# Patient Record
Sex: Male | Born: 1945
Health system: Southern US, Community
[De-identification: ages and names within clinical notes are randomized; demographics above are authoritative.]

## PROBLEM LIST (undated history)

## (undated) DIAGNOSIS — R32 Unspecified urinary incontinence: Secondary | ICD-10-CM

## (undated) DIAGNOSIS — N400 Enlarged prostate without lower urinary tract symptoms: Secondary | ICD-10-CM

## (undated) DIAGNOSIS — I639 Cerebral infarction, unspecified: Secondary | ICD-10-CM

## (undated) DIAGNOSIS — R361 Hematospermia: Secondary | ICD-10-CM

## (undated) DIAGNOSIS — M5136 Other intervertebral disc degeneration, lumbar region: Secondary | ICD-10-CM

## (undated) DIAGNOSIS — R21 Rash and other nonspecific skin eruption: Secondary | ICD-10-CM

## (undated) DIAGNOSIS — N481 Balanitis: Secondary | ICD-10-CM

## (undated) DIAGNOSIS — E7800 Pure hypercholesterolemia, unspecified: Secondary | ICD-10-CM

## (undated) DIAGNOSIS — R3911 Hesitancy of micturition: Secondary | ICD-10-CM

## (undated) DIAGNOSIS — I509 Heart failure, unspecified: Secondary | ICD-10-CM

## (undated) DIAGNOSIS — I2510 Atherosclerotic heart disease of native coronary artery without angina pectoris: Secondary | ICD-10-CM

## (undated) DIAGNOSIS — M79659 Pain in unspecified thigh: Secondary | ICD-10-CM

## (undated) DIAGNOSIS — K219 Gastro-esophageal reflux disease without esophagitis: Secondary | ICD-10-CM

## (undated) DIAGNOSIS — E119 Type 2 diabetes mellitus without complications: Secondary | ICD-10-CM

## (undated) DIAGNOSIS — R339 Retention of urine, unspecified: Secondary | ICD-10-CM

## (undated) DIAGNOSIS — N390 Urinary tract infection, site not specified: Secondary | ICD-10-CM

## (undated) DIAGNOSIS — R55 Syncope and collapse: Secondary | ICD-10-CM

## (undated) DIAGNOSIS — M51369 Other intervertebral disc degeneration, lumbar region without mention of lumbar back pain or lower extremity pain: Secondary | ICD-10-CM

## (undated) DIAGNOSIS — M5416 Radiculopathy, lumbar region: Secondary | ICD-10-CM

## (undated) DIAGNOSIS — I10 Essential (primary) hypertension: Secondary | ICD-10-CM

## (undated) HISTORY — DX: Hematospermia: R36.1

## (undated) HISTORY — DX: Cerebral infarction, unspecified: I63.9

## (undated) HISTORY — DX: Benign prostatic hyperplasia without lower urinary tract symptoms: N40.0

## (undated) HISTORY — DX: Retention of urine, unspecified: R33.9

## (undated) HISTORY — PX: APPENDECTOMY: SHX54

## (undated) HISTORY — DX: Syncope and collapse: R55

## (undated) HISTORY — DX: Rash and other nonspecific skin eruption: R21

## (undated) HISTORY — DX: Pure hypercholesterolemia, unspecified: E78.00

## (undated) HISTORY — DX: Other intervertebral disc degeneration, lumbar region without mention of lumbar back pain or lower extremity pain: M51.369

## (undated) HISTORY — DX: Heart failure, unspecified: I50.9

## (undated) HISTORY — DX: Balanitis: N48.1

## (undated) HISTORY — DX: Essential (primary) hypertension: I10

## (undated) HISTORY — DX: Unspecified urinary incontinence: R32

## (undated) HISTORY — PX: GALLBLADDER SURGERY: SHX652

## (undated) HISTORY — DX: Pain in unspecified thigh: M79.659

## (undated) HISTORY — DX: Gastro-esophageal reflux disease without esophagitis: K21.9

## (undated) HISTORY — PX: SHOULDER SURGERY: SHX246

## (undated) HISTORY — PX: CORONARY ANGIOPLASTY: SHX604

## (undated) HISTORY — PX: CARDIAC SURGERY: SHX584

## (undated) HISTORY — PX: CARDIAC CATHETERIZATION: SHX172

## (undated) HISTORY — PX: BACK SURGERY: SHX140

## (undated) HISTORY — DX: Urinary tract infection, site not specified: N39.0

## (undated) HISTORY — PX: REPLACEMENT TOTAL KNEE: SUR1224

## (undated) HISTORY — DX: Hesitancy of micturition: R39.11

## (undated) HISTORY — DX: Atherosclerotic heart disease of native coronary artery without angina pectoris: I25.10

## (undated) HISTORY — PX: CHOLECYSTECTOMY: SHX55

## (undated) HISTORY — DX: Other intervertebral disc degeneration, lumbar region: M51.36

## (undated) HISTORY — DX: Radiculopathy, lumbar region: M54.16

## (undated) HISTORY — PX: LAMINECTOMY: SHX219

## (undated) HISTORY — DX: Type 2 diabetes mellitus without complications: E11.9

---

## 1999-10-17 ENCOUNTER — Ambulatory Visit (HOSPITAL_BASED_OUTPATIENT_CLINIC_OR_DEPARTMENT_OTHER): Admission: RE | Admit: 1999-10-17 | Discharge: 1999-10-17 | Payer: Self-pay | Admitting: Orthopedic Surgery

## 2000-04-25 ENCOUNTER — Ambulatory Visit (HOSPITAL_BASED_OUTPATIENT_CLINIC_OR_DEPARTMENT_OTHER): Admission: RE | Admit: 2000-04-25 | Discharge: 2000-04-25 | Payer: Self-pay | Admitting: Orthopedic Surgery

## 2000-05-17 ENCOUNTER — Emergency Department (HOSPITAL_COMMUNITY): Admission: EM | Admit: 2000-05-17 | Discharge: 2000-05-18 | Payer: Self-pay | Admitting: Emergency Medicine

## 2000-07-24 ENCOUNTER — Encounter: Payer: Self-pay | Admitting: Orthopedic Surgery

## 2000-07-28 ENCOUNTER — Inpatient Hospital Stay (HOSPITAL_COMMUNITY): Admission: RE | Admit: 2000-07-28 | Discharge: 2000-08-01 | Payer: Self-pay | Admitting: Orthopedic Surgery

## 2001-09-23 ENCOUNTER — Ambulatory Visit (HOSPITAL_BASED_OUTPATIENT_CLINIC_OR_DEPARTMENT_OTHER): Admission: RE | Admit: 2001-09-23 | Discharge: 2001-09-23 | Payer: Self-pay | Admitting: Orthopedic Surgery

## 2002-05-07 ENCOUNTER — Inpatient Hospital Stay (HOSPITAL_COMMUNITY): Admission: EM | Admit: 2002-05-07 | Discharge: 2002-05-09 | Payer: Self-pay | Admitting: Cardiology

## 2002-05-28 ENCOUNTER — Inpatient Hospital Stay (HOSPITAL_COMMUNITY): Admission: EM | Admit: 2002-05-28 | Discharge: 2002-05-31 | Payer: Self-pay | Admitting: Cardiology

## 2002-05-31 ENCOUNTER — Encounter: Payer: Self-pay | Admitting: Cardiology

## 2002-06-10 ENCOUNTER — Encounter: Payer: Self-pay | Admitting: Cardiology

## 2002-06-10 ENCOUNTER — Inpatient Hospital Stay (HOSPITAL_COMMUNITY): Admission: EM | Admit: 2002-06-10 | Discharge: 2002-06-12 | Payer: Self-pay | Admitting: Cardiology

## 2003-11-07 ENCOUNTER — Ambulatory Visit (HOSPITAL_COMMUNITY): Admission: RE | Admit: 2003-11-07 | Discharge: 2003-11-07 | Payer: Self-pay | Admitting: Neurosurgery

## 2007-11-06 ENCOUNTER — Encounter: Admission: RE | Admit: 2007-11-06 | Discharge: 2007-11-06 | Payer: Self-pay | Admitting: Neurosurgery

## 2008-02-09 ENCOUNTER — Inpatient Hospital Stay (HOSPITAL_COMMUNITY): Admission: RE | Admit: 2008-02-09 | Discharge: 2008-02-15 | Payer: Self-pay | Admitting: Neurosurgery

## 2008-03-17 ENCOUNTER — Encounter: Admission: RE | Admit: 2008-03-17 | Discharge: 2008-03-17 | Payer: Self-pay | Admitting: Neurosurgery

## 2008-05-31 ENCOUNTER — Encounter: Admission: RE | Admit: 2008-05-31 | Discharge: 2008-05-31 | Payer: Self-pay | Admitting: Neurosurgery

## 2008-06-05 ENCOUNTER — Encounter: Admission: RE | Admit: 2008-06-05 | Discharge: 2008-06-05 | Payer: Self-pay | Admitting: Neurosurgery

## 2008-07-18 ENCOUNTER — Inpatient Hospital Stay (HOSPITAL_COMMUNITY): Admission: RE | Admit: 2008-07-18 | Discharge: 2008-07-24 | Payer: Self-pay | Admitting: Neurosurgery

## 2009-03-19 IMAGING — CR DG LUMBAR SPINE 2-3V
3 series · 3 of 3 positions shown · non-contrast
Comparison: Intraoperative imaging on 02/09/2008

CLINICAL DATA: Status post posterior lumbar fusion at L4-5 on
02/09/2008.  The patient has complaint of weakness.

LUMBAR SPINE - 2-3 VIEW

[view not recorded (1 of 3)]
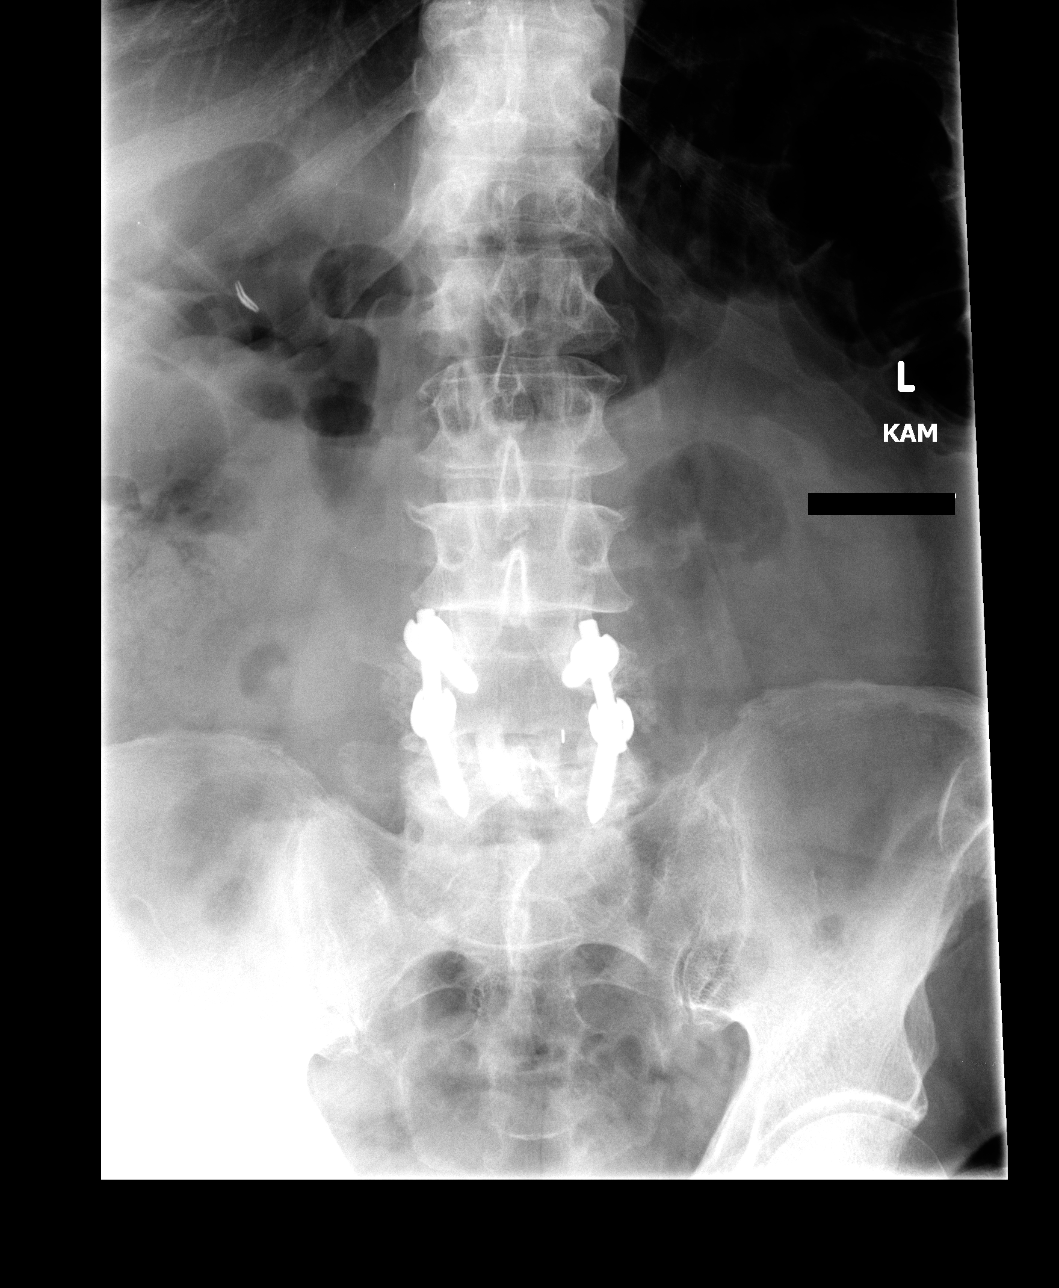

[view not recorded (2 of 3)]
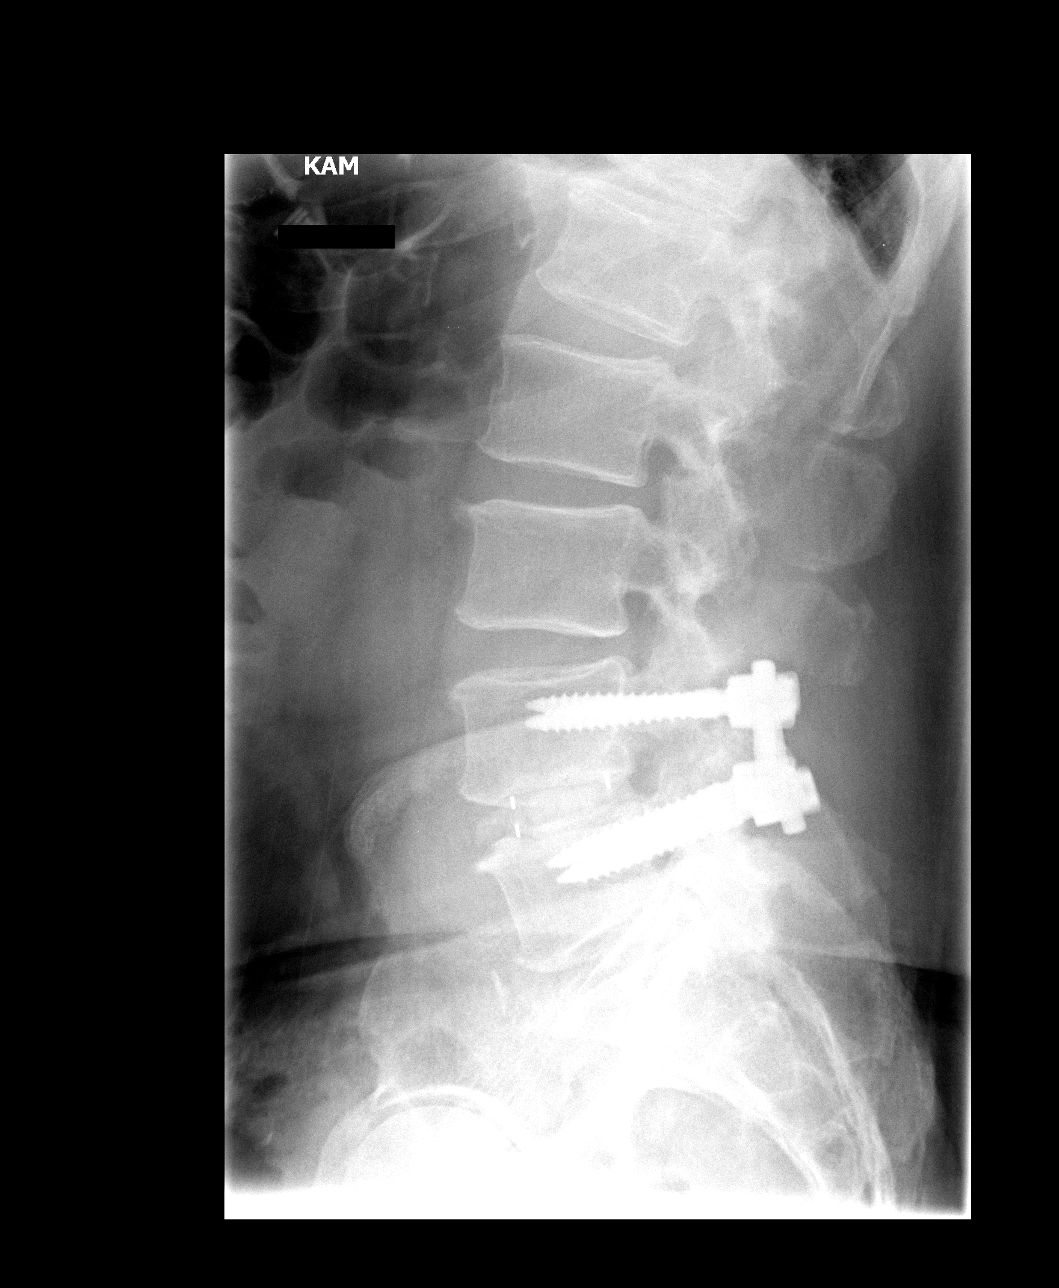

[view not recorded (3 of 3)]
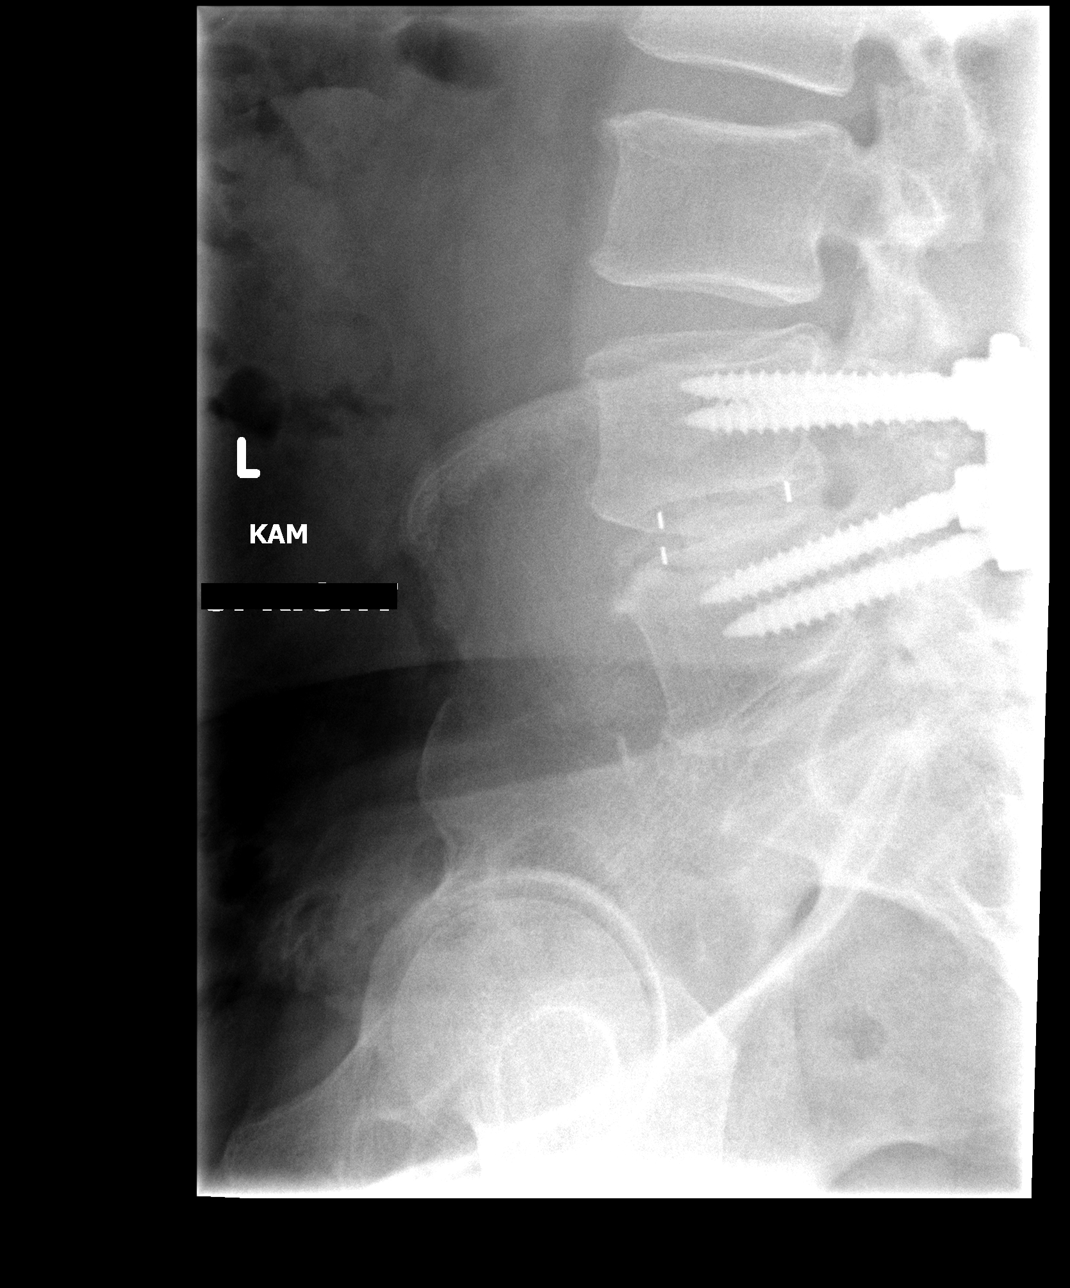

[3 of 3 positions shown; findings below may reference images not displayed]

FINDINGS: Frontal and lateral projections demonstrate stable and
anatomic alignment at the L4-5 level following posterior lumbar
interbody fusion with bilateral L4 laminectomy.  Hardware and
allograft material show normal alignment.  No evidence of fracture.
No abnormal lucency identified.  Other levels of the lumbar spine
demonstrate mild spondylosis and normal alignment.
IMPRESSION: Normal alignment following recent posterior lumbar fusion at L4-5.
No complicating features are seen by standard radiographs.

## 2010-04-15 LAB — GLUCOSE, CAPILLARY
Glucose-Capillary: 135 mg/dL — ABNORMAL HIGH (ref 70–99)
Glucose-Capillary: 140 mg/dL — ABNORMAL HIGH (ref 70–99)
Glucose-Capillary: 53 mg/dL — ABNORMAL LOW (ref 70–99)
Glucose-Capillary: 77 mg/dL (ref 70–99)
Glucose-Capillary: 108 mg/dL — ABNORMAL HIGH (ref 70–99)
Glucose-Capillary: 110 mg/dL — ABNORMAL HIGH (ref 70–99)
Glucose-Capillary: 131 mg/dL — ABNORMAL HIGH (ref 70–99)
Glucose-Capillary: 151 mg/dL — ABNORMAL HIGH (ref 70–99)
Glucose-Capillary: 63 mg/dL — ABNORMAL LOW (ref 70–99)
Glucose-Capillary: 65 mg/dL — ABNORMAL LOW (ref 70–99)

## 2010-04-15 LAB — TYPE AND SCREEN

## 2010-04-15 LAB — BASIC METABOLIC PANEL
BUN: 10 mg/dL (ref 6–23)
BUN: 14 mg/dL (ref 6–23)
CO2: 31 mEq/L (ref 19–32)
Chloride: 102 mEq/L (ref 96–112)
Chloride: 99 mEq/L (ref 96–112)
GFR calc Af Amer: >60 mL/min (ref >60)
Potassium: 3 mEq/L — ABNORMAL LOW (ref 3.5–5.1)

## 2010-04-15 LAB — CBC
HCT: 41.9 % (ref 39.0–52.0)
Hemoglobin: 14.2 g/dL (ref 13.0–17.0)
RBC: 4.65 MIL/uL (ref 4.22–5.81)
RDW: 14.7 % (ref 11.5–15.5)
WBC: 8.3 10*3/uL (ref 4.0–10.5)

## 2010-04-23 LAB — DIFFERENTIAL
Basophils Absolute: 0.1 10*3/uL (ref 0.0–0.1)
Basophils Relative: 1 % (ref 0–1)
Eosinophils Absolute: 0.2 10*3/uL (ref 0.0–0.7)
Eosinophils Relative: 2 % (ref 0–5)
Monocytes Absolute: 0.9 10*3/uL (ref 0.1–1.0)

## 2010-04-23 LAB — CBC
HCT: 45.8 % (ref 39.0–52.0)
Hemoglobin: 15.4 g/dL (ref 13.0–17.0)
MCHC: 33.7 g/dL (ref 30.0–36.0)
MCV: 93.1 fL (ref 78.0–100.0)
RDW: 14.2 % (ref 11.5–15.5)

## 2010-04-24 LAB — GLUCOSE, CAPILLARY
Glucose-Capillary: 136 mg/dL — ABNORMAL HIGH (ref 70–99)
Glucose-Capillary: 139 mg/dL — ABNORMAL HIGH (ref 70–99)
Glucose-Capillary: 145 mg/dL — ABNORMAL HIGH (ref 70–99)
Glucose-Capillary: 148 mg/dL — ABNORMAL HIGH (ref 70–99)
Glucose-Capillary: 148 mg/dL — ABNORMAL HIGH (ref 70–99)
Glucose-Capillary: 150 mg/dL — ABNORMAL HIGH (ref 70–99)
Glucose-Capillary: 153 mg/dL — ABNORMAL HIGH (ref 70–99)
Glucose-Capillary: 153 mg/dL — ABNORMAL HIGH (ref 70–99)
Glucose-Capillary: 153 mg/dL — ABNORMAL HIGH (ref 70–99)
Glucose-Capillary: 182 mg/dL — ABNORMAL HIGH (ref 70–99)
Glucose-Capillary: 195 mg/dL — ABNORMAL HIGH (ref 70–99)

## 2010-04-24 LAB — PROTIME-INR
INR: 1 (ref 0.00–1.49)
Prothrombin Time: 13.7 seconds (ref 11.6–15.2)

## 2010-04-24 LAB — POCT I-STAT 4, (NA,K, GLUC, HGB,HCT)
Glucose, Bld: 156 mg/dL — ABNORMAL HIGH (ref 70–99)
Hemoglobin: 15 g/dL (ref 13.0–17.0)
Potassium: 3.5 mEq/L (ref 3.5–5.1)

## 2010-05-22 NOTE — Op Note (Signed)
[name], [name] NO.:  [id number]   MEDICAL RECORD NO.:  [id number]          PATIENT TYPE:  INP   LOCATION:  3034                         FACILITY:  [facility]   PHYSICIAN:  Kathaleen Maser. Pool, M.D.    DATE OF BIRTH:  1945/11/25   DATE OF PROCEDURE:  07/18/2008  DATE OF DISCHARGE:                               OPERATIVE REPORT   PREOPERATIVE DIAGNOSIS:  L3-L4 degenerative disk disease/herniated  nucleus pulposus with stenosis.  Status post L4-L5 decompression and  fusion with instrumentation.   POSTOPERATIVE DIAGNOSIS:  L3-L4 degenerative disk disease/herniated  nucleus pulposus with stenosis.  Status post L4-L5 decompression and  fusion with instrumentation.   PROCEDURE:  Re-exploration of L4-L5 fusion with removal of  instrumentation.  L3-L4 redo decompressive laminectomy with bilateral L3  and L4 decompressive foraminotomies, more than would be required for  simple interbody fusion alone.  L3-L4 posterior lumbar fusion utilizing  tangent interbody allograft wedge Telamonted by PEEK cage and local  autografting.  L3, L4, L5 posterolateral arthrodesis utilizing segmental  pedicle screw fixation and local autografting.   SURGEON:  Kathaleen Maser. Pool, MD   ASSISTANT:  Donalee Citrin, MD   ANESTHESIA:  General endotracheal.   INDICATION:  [name] is a 65 year old male who is approximately 5  months status post L4-L5 decompression and fusion with instrumentation.  The patient presents with worsening back and bilateral lower extremity  pain.  Workup demonstrates evidence of disk degeneration, disk space  collapse and disk herniation at L3-L4 which is above the level of his  fusion.  This results in moderately severe stenosis.  The patient has  failed conservative management and presents now for decompression and  fusion at the L3-L4 level.  This will require revising of the L4-L5  instrumentation.   OPERATIVE NOTE:  The patient was brought to the operating room and  placed on operating table in supine position.  After adequate level of  anesthesia was achieved, the patient was placed prone onto Wilson frame  and appropriately padded.  The patient's lumbar region was prepped and  draped sterilely.  A 10 blade was used to make a curvilinear skin  incision overlying the L3, L4 and L5 levels.  This was carried down  sharply in midline.  A subperiosteal dissection was then performed to  expose the lamina facet joints of L3, L4 as well as the previous fusion  at L4-L5.  Instrumentation at L4-L5 was exposed.  Pedicle screw  instrumentation was disassembled.  The pedicle screws themselves were  solidly in position and the fusion appeared to be maturing nicely.  Retractor was then placed over the L3-L4 level.  Previous laminectomy at  L3-L4 was dissected free.  Complete laminectomy of L3 was then performed  using Leksell rongeurs, Kerrison rongeurs, high-speed drill to remove  the entire lamina of L3.  Complete inferior facetectomies of L3 and  superior facetectomies of L4 were performed bilaterally.  All bone was  cleaned and used for later autografting.  Ligamentum flavum and epidural  scar were elevated and resected in piecemeal fashion using Kerrison  rongeurs.  Epidural venous  plexus was coagulated and cut.  Bilateral  diskectomies were then performed at L3-L4.  Disk space was then  distracted up to 10 mm through with the disk space distracted and nerve  roots protected starting first at the patient's left side.  Disk space  was then reamed and then cut with 10 mm tangent instruments.  Soft  tissues were then removed from interspace.  A 10 x 26 mm Telamon cage  was packed with morselized autograft then packed into place and recessed  approximately 2 mm from posterior cortical margin of L3.  The fracture  was removed from the patient's right side.  Thecal sac and nerve roots  were inspected on the right side.  Disk space once again reamed and cut  with a  10 mm tangent instrument.  Soft tissues were removed from the  interspace.  Disk space was further curettaged using Epstein curettes.  Morselized autograft was packed into interspace for later fusion.  A 10  x 26 mm tangent wedge was then packed into place and recessed  approximately 2 mm from posterior cortical margin of L3.  Pedicles were  all freed were then identified bilaterally using superficial surface  landmarks and intraoperative fluoroscopy.  Superficial bone around the  pedicle was then removed with a high-speed drill.  Each pedicle was then  probed using pedicle awl.  The pedicle awl track was then tapped with a  5.25 mm screw taper.  Each screw tap hole was then probed and found to  be solidly within bone.  A 6.75 x 50 mm radius screws were placed  bilaterally at L3.  Transverse processes of L3, L4 and L5 were then  decorticated using high speed drill.  Morselized autograft was packed  posterolaterally for later fusion.  A short segment of titanium rods  were then placed over the screw heads at L3, L4 and L5.  Locking caps  were placed over screw heads and locking caps were then engaged with  construct under compression.  Final images revealed good position of  bone graft and hardware at proper operative level with normal alignment  of spine.  The wound was then irrigated with antibiotic solution.  Gelfoam was placed topically for hemostasis and found to be good.  A  medium Hemovac drain was left in epidural space.  A transverse connector  had been placed earlier.  The wound was then closed in layers with  Vicryl suture.  Steri-Strips and sterile dressing were applied.  There  were no complications.  The patient tolerated the procedure well and he  returned to recovery room postoperatively.           ______________________________  Kathaleen Maser Pool, M.D.     HAP/MEDQ  D:  07/18/2008  T:  07/19/2008  Job:  [id number]

## 2010-05-22 NOTE — Op Note (Signed)
[name], [name] NO.:  [id number]   MEDICAL RECORD NO.:  [id number]          PATIENT TYPE:  INP   LOCATION:  3034                         FACILITY:  [facility]   PHYSICIAN:  Kathaleen Maser. Pool, M.D.    DATE OF BIRTH:  August 23, 1945   DATE OF PROCEDURE:  02/09/2008  DATE OF DISCHARGE:                               OPERATIVE REPORT   PREOPERATIVE DIAGNOSIS:  L4-5 post laminectomy spondylolisthesis with  instability and stenosis.   POSTOPERATIVE DIAGNOSIS:  L4-5 post laminectomy spondylolisthesis with  instability and stenosis.   PROCEDURES:  Re-exploration of bilateral L4-5, decompressive  laminotomies with bilateral L4-5 redo-laminectomy and diskectomies, more  than  would be required for simple interbody fusion alone.  Bilateral L4  and L5 decompressive foraminotomies.  L4-5 posterior lumbar fusion  utilizing tangent interbody allograft wedge, Telamon interbody PEEK  cage, and local autografting.  L4-5 posterolateral arthrodesis utilizing  nonsegmental pedicle screw fixation and local autografting.   SURGEON:  Kathaleen Maser. Pool, MD   ASSISTANT:  Reinaldo Meeker, MD   ANESTHESIA:  General endotracheal.   INDICATIONS:  [name] [name] is a 65 year old male status post previous  bilateral L4-5 decompressive laminotomies for stenosis.  The patient  presents now with worsening back and bilateral lower extremity pain.  Workup demonstrates evidence of an unstable spondylolisthesis at L4-5  with marked stenosis.  The patient presents now for decompression and  fusion in hopes of improving his symptoms.   OPERATIVE NOTE:  The patient was brought to the operating room and  placed on table in supine position.  After adequate level of anesthesia  was achieved, the patient was placed prone onto Wilson frame,  appropriately padded.  The patient's lumbar region was prepped and  draped in sterilely.  A 10 blade was used to make a curvilinear skin  incision overlying the L4-5  interspace.  This was carried down sharply  in the midline.  Subperiosteal dissection was then performed exposing  the lamina and facet joints of L3-4 and L5.  Deep self-retaining  retractor was placed.  Intraoperative fluoroscopy was used and levels  were confirmed.  Transverse process of L4 and L5 were also exposed.  Previous laminotomies at L4-5 were then dissected free bilaterally.  Epidural scar was removed.  Complete laminectomies of L4 was performed  bilaterally as well as inferior facetectomies at L4 and superior  facetectomies at L5.  Partial superior laminectomy at L5 was also  performed.  All bone was cleaned and used in later autografting.  Ligament flavum and epidural scar was elevated and resected.  The  underlying thecal sac and exiting L4 and L5 nerve roots were identified.  Wide decompressive foraminotomies were then performed along the course  of exiting L4 and L5 nerve roots bilaterally.  Epidural venous plexus  was coagulated and cut.  Bilateral diskectomies were then performed.  Disk space was then prepared for interbody fusion.  First, the disk  space was distracted up to 12 mm and a 12-mm distractor was left in  place in the patient's left side.  Thecal sac nerve roots were checked  on  the right side.  Disk space was then reamed and cut with 12-mm  tangent instrument.  Soft tissues were removed from the interspace.  A  12 x 26 mm Telamon cage packed with morselized autograft and then packed  into place and recessed approximately 3 mm from the posterior cortical  margin at L4.  Distractor was removed from the patient's left side.  Thecal sac and nerve spared on the left side.  Disk space was then  reamed and then cut with a 12-mm tangent instrument.  Soft tissue was  then removed from the interspace.  Disk space was further curettaged.  Morselized autograft was packed in the interspace for later fusion.  A  12 x 26 mm tangent wedge was then packed into place and  recessed  approximately 3 mm from the posterior cortical margin of L4.  Pedicles  of L4 and L5 were then identified using surface landmarks and  intraoperative fluoroscopy.  Superficial bone around the pedicle was  then removed using high-speed drill.  The pedicle hole was then probed  using pedicle awl.  Pedicle awl track was then tapped with 5.25 screw  and screwed down hole was probed and found to be solid with the bone.  A  6.75 x 45 mm radius screw was placed bilaterally at L4 and L5.  Transverse processes of L4 and L5 were then decorticated using high-  speed drill.  Morselized autograft was packed posterolaterally for later  fusion.  Short segment titanium rod was then placed over screw heads at  L4 and L5.  Locking caps were then placed in the screw.  Locking caps  were then engaged with construct under compression.  Final images  revealed good position of bone grafts and proper operative level with  normal alignment of spine.  Wound was then irrigated with antibiotic  solution.  Gelfoam was placed topically.  Hemostasis was found to be  good.  A medium Hemovac drain was left in the interspace.  Wounds were  then closed in layers with Vicryl suture.  Steri-Strips and sterile  dressing were applied.  No complications.  The patient was well and he  returns to recovery room postoperatively.           ______________________________  Kathaleen Maser Pool, M.D.     HAP/MEDQ  D:  02/09/2008  T:  02/10/2008  Job:  [id number]

## 2010-05-25 NOTE — Discharge Summary (Signed)
NAMECASPAR, FAVILA NO.:  [id number]   MEDICAL RECORD NO.:  [id number]          PATIENT TYPE:  INP   LOCATION:  3034                         FACILITY:  [facility]   PHYSICIAN:  Kathaleen Maser. Pool, M.D.    DATE OF BIRTH:  Jan 16, 1945   DATE OF ADMISSION:  02/09/2008  DATE OF DISCHARGE:  02/15/2008                               DISCHARGE SUMMARY   FINAL DIAGNOSIS:  L4-5 stenosis with degenerative disk disease.   OPERATIONS AND TREATMENT:  L4-5 bilateral redo laminectomy with  posterior lumbar interbody fusion utilizing tangent interbody allograft  wedge, Telamon interbody PEEK cage, and posterolateral arthrodesis  utilizing nonsegmental pedicle screw fixation.   HISTORY OF PRESENT ILLNESS:  Mr. Ramirez is a 65 year old male with  history of symptomatic lumbar stenosis.  He presents now for  decompression and fusion.   HOSPITAL COURSE:  The patient was taken to the operating room where an  uncomplicated lumbar decompression and fusion was performed.  Postoperatively, the patient did well.  He was somewhat slow to mobilize  secondary to pain control issues and social situation.  This gradually  improved his mobility, he got better.  He was able to be discharged  home.  At the time of discharge, his pain was very well controlled on  oral medications.  He had no lower extremity pain.  His strength and  sensation were intact.  His wound was healing well.  He was tolerating a  regular diet.   CONDITION AT DISCHARGE:  Improved.   DISCHARGE DISPOSITION:  The patient to follow up in my office in 1 week.            ______________________________  Kathaleen Maser. Pool, M.D.     HAP/MEDQ  D:  03/22/2008  T:  03/23/2008  Job:  [id number]

## 2010-05-25 NOTE — Op Note (Signed)
NAMECORDALE, MANERA NO.:  [id number]   MEDICAL RECORD NO.:  [id number]          PATIENT TYPE:  OIB   LOCATION:  2899                         FACILITY:  [facility]   PHYSICIAN:  Kathaleen Maser. Pool, M.D.    DATE OF BIRTH:  1945/02/12   DATE OF PROCEDURE:  11/07/2003  DATE OF DISCHARGE:                                 OPERATIVE REPORT   PREOPERATIVE DIAGNOSIS:  L4-5 stenosis.   POSTOPERATIVE DIAGNOSIS:  L4-5 stenosis.   PROCEDURE:  L4-5 decompressive lumbar laminectomy with foraminotomies.   SURGEON:  Kathaleen Maser. Pool, M.D.   ASSISTANT:  Reinaldo Meeker, M.D.   ANESTHESIA:  General endotracheal.   INDICATIONS FOR PROCEDURE:  The patient is a 58-yer-old male with a history  of back and bilateral lower extremity pain failing conservative management.  A workup demonstrates evidence of severe stenosis at the L4-5 level.  The  patient presents now for an L4-5 decompressive laminectomy in hopes of  improving his symptoms.   DESCRIPTION OF PROCEDURE:  The patient is placed on the operating room table  in a supine position.  After an adequate level of anesthesia had been  achieved, the patient was placed prone onto a Wilson frame and appropriately  padded.  The patient's lumbar region was prepped and draped sterilely.  A  #10 blade was used to make a linear skin incision overlying the L4-5  interspace.  This was carried down sharply in the midline.  A subperiosteal  dissection was performed to expose the lamina of the facet joints at L4 and  L5.  A deep self-retaining retractor was placed.  Intraoperative x-rays were  taken.  The level was confirmed.  A decompressive laminectomy was then  performed by using the Leksell rongeurs and Kerrison rongeurs and high-speed  drill to remove the inferior aspect of the lamina of L4, the superior aspect  of the lamina of L5, and the medial aspect of the L4 and L5 facet joints.  The ligament of flavum was then elevated and resected in a  piecemeal fashion  using Kerrison rongeurs.  The underlying thecal sac was identified.  The  lateral gutters were then undercut using Kerrison rongeurs to fully  decompress the lateral recess bilaterally.  The L4 and L5 nerve roots were  identified and found to be free of any further compression.  The wound was  then irrigated with antibiotic solution.  Gelfoam was placed topically for  hemostasis and found to be good.  The retraction system was removed.  Hemostasis in the muscles achieved with electrocautery.  The wound was then  closed in layers with Vicryl sutures.  Steri-Strips and a sterile dressing  were applied.  There were no apparent complications.  The patient returns to the recovery room postoperatively.     HAP/MEDQ  D:  11/07/2003  T:  11/07/2003  Job:  [id number]

## 2010-05-25 NOTE — H&P (Signed)
NAME:  [name] [name] NO.:  [id number]   MEDICAL RECORD NO.:  [id number]                   PATIENT TYPE:  INP   LOCATION:  2920                                 FACILITY:  [facility]   PHYSICIAN:  [place] Bing, M.D.               DATE OF BIRTH:  01/12/1945   DATE OF ADMISSION:  05/07/2002  DATE OF DISCHARGE:                                HISTORY & PHYSICAL   PRIMARY CARDIOLOGIST:  Dr. Gilford Raid Harsh.   HISTORY OF PRESENT ILLNESS:  A 65 year old gentleman transferred from  [facility] with acute myocardial infarction.  [name] [name] has not  previously been known to have coronary disease.  The night before admission,  he developed moderately-severe, sharp right upper chest pain that was  persistent and radiated through to his back.  He was diaphoretic with this  episode that lasted two to three hours.  He had subjective less intense pain  8 and 14 hours later prompting him to go to the emergency department where  no notable EKG changes were apparent.  He was admitted for observation and  serial determination of markers and EKGs.  He had no further symptoms after  initial medical treatment but was found to have markedly-elevated and mildly-  elevated CPK-MB prompting referral to [facility].   CARDIOVASCULAR RISK FACTORS:  Include diabetes and hypertension.  Both of  these problems improved substantially when the patient dieted, resulting in  a 75-pound weight loss.  He has not required medical therapy for either of  these during the past year.  There is a history of hyperlipidemia that has  been treated with low doses of simvastatin.   PAST MEDICAL HISTORY:  1. Osteoarthritis.  He has previously undergone left total knee replacement     surgery.  He has also had right shoulder surgery.  2. There is a history of depression.  3. He has had recent prostatitis and is still taking antibiotics.   CURRENT MEDICATIONS:  1. Serzone 100 mg b.i.d.  2. Simvastatin 10 mg daily.  3. Ambien 10 mg q.h.s.  4. Ciprofloxacin 500 mg b.i.d.   SOCIAL HISTORY:  Lives in [place] with wife.  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  He does not use tobacco products nor significant amounts of  alcohol.   FAMILY HISTORY:  Positive for coronary disease in his father, who suffered a  fatal myocardial infarction at age 74.   REVIEW OF SYSTEMS:  Has seasonal allergies, has occasional cough,  intermittent depression, arthralgias, GERD symptoms.  All other systems  negative.   PHYSICAL EXAMINATION:  GENERAL:  A pleasant, somewhat overweight gentleman  in no acute distress.  VITAL SIGNS:  Temperature 98, heart rate 60 and regular, respirations 20,  blood pressure 135/65, O2 saturation 99% on 2 L.  HEENT:  Anicteric sclerae.  NECK:  No jugular venous distention.  No carotid bruits.  ENDOCRINE:  No  thyromegaly.  SKIN:  No significant lesions.  HEMATOPOIETIC:  No adenopathy.  CARDIAC:  Normal first and second heart sounds.  Fourth heart sound present.  LUNGS:  Clear.  ABDOMEN:  Soft and nontender.  No organomegaly.  No bruits.  EXTREMITIES:  Decreased dorsalis pedis pulses.  Posterior tibial pulses are  bounding.  NEUROMUSCULAR:  Symmetric strength and tone.   LABORATORY STUDIES:  Chest x-ray:  NAD.  EKG reportedly shows sinus  bradycardia with first-degree AV block and no acute changes - not available  for review.  Peak CPK so far is 242 with an MB of 24; however, troponin has  increased to 41.6.  Renal function is normal.  CBC is normal.   IMPRESSION:  [name] [name] presents with somewhat atypical chest discomfort  but certainly with a non-Q myocardial infarction.  He has responded well to  initial medical therapy.  Use of beta blockers is limited by his native  sinus bradycardia.   PLAN:  He will undergo another diagnostic catheterization today with further  therapy as warranted.                                               [place] Bing,  M.D.    RR/MEDQ  D:  05/07/2002  T:  05/07/2002  Job:  [id number]

## 2010-05-25 NOTE — Op Note (Signed)
NAME:  [name] [name], [name] [name]                          ACCOUNT NO.:  [id number]   MEDICAL RECORD NO.:  [id number]                   PATIENT TYPE:  AMB   LOCATION:  DSC                                  FACILITY:  [facility]   PHYSICIAN:  Harvie Junior, M.D.                DATE OF BIRTH:  12/20/1945   DATE OF PROCEDURE:  09/23/2001  DATE OF DISCHARGE:                                 OPERATIVE REPORT   PREOPERATIVE DIAGNOSIS:  Impingement with acromioclavicular joint arthritis.   POSTOPERATIVE DIAGNOSES:  1. Impingement with acromioclavicular joint arthritis.  2. Anterior labral tear with significant fray of biceps tendon and     undersurface rotator cuff tear.   PROCEDURES:  1. Anterolateral acromioplasty.  2. Distal clavicle excision.  3. Debridement of anterior labral tear from within the glenohumeral joint as     well as debridement of insertion of biceps tendon as well as debridement     of rotator cuff undersurface.   SURGEON:  Harvie Junior, M.D.   ASSISTANT:  Currie Paris. Thedore Mins.   ANESTHESIA:  General with an interscalene block.   BRIEF HISTORY:  The patient is a 65 year old male with a long history of  significant bilateral shoulder pain.  He ultimately had been treated  conservatively with injection therapy, anti-inflammatory medication,  exercise therapy, stretch.  All of this failed, and ultimately he continued  to have pain.  He had excellent relief with his injections and we felt that  the most appropriate course of action was subacromial decompression and  distal clavicle excision.  He is brought to the operating room for this  procedure.   DESCRIPTION OF PROCEDURE:  The patient was brought to the operating room and  after adequate anesthesia was obtained with a general anesthetic, the  patient was placed supine upon the operating table and moved to the beach  chair position.  All bony prominences were well-padded.  Attention was then  turned to the right shoulder,  where after routine prepping and draping an  arthroscopic examination of the shoulder revealed that there was an obvious  anterior labral tear with catching and clinic of the joint.  There was fray  of the undersurface of the biceps tendon and fray of the insertion of the  rotator cuff.  The labral tear was debrided, the undersurface of the biceps  tendon was debrided, and the insertion of the rotator cuff was debrided.  Attention was then turned out of the glenohumeral joint.  There was no  significant arthritic change in the glenohumeral joint.  Attention was then  turned out of the glenohumeral joint into the subacromial space.  At this  point attention was turned to the acromion, where there was a significant  undersurface spur on the acromion, and this was debrided by way of an  anterolateral acromioplasty from both the lateral and posterior portal.  Cutting block technique  was used ultimately to refine the debridement of the  acromion.  Following this attention was turned to the distal clavicle, where  the distal clavicle was debrided from both the anterior and lateral  compartment.  Twenty millimeters of distal clavicle was excised, measured  with an arthroscopy probe.  At this point attention was turned back to the  rotator cuff, where the rotator cuff was evaluated.  There was no full-  thickness tearing.  There was significant bursal fray on the rotator cuff,  and this was debrided over the course of its length with a bur, in essence  performing a bursectomy as well as removing all remnants of bony fragment  from the acromioplasty.  Following this the shoulder was copiously irrigated  and suctioned dry.  The arthroscopic portals were closed with a bandage, a  sterile and compressive dressing was applied, and the patient taken to the  recovery room, where he was noted to be in stable condition.  Estimated  blood loss for the procedure was none.                                                Harvie Junior, M.D.    Ranae Plumber  D:  09/23/2001  T:  09/24/2001  Job:  [id number]

## 2010-05-25 NOTE — Discharge Summary (Signed)
NAME:  [name] [name], [name] [name]                          ACCOUNT NO.:  [id number]   MEDICAL RECORD NO.:  [id number]                   PATIENT TYPE:  INP   LOCATION:  4715                                 FACILITY:  [facility]   PHYSICIAN:  Jesse Sans. Wall, M.D.                DATE OF BIRTH:  [birth year]   DATE OF ADMISSION:  05/28/2002  DATE OF DISCHARGE:                           DISCHARGE SUMMARY - REFERRING   PROCEDURES:  1. Cardiac catheterization.  2. Coronary arteriogram.  3. Left ventriculogram.   HOSPITAL COURSE:  Mr. Fix is a 65 year old male with known coronary  artery disease.  He was admitted for an MI on 05/07/2002 and discharged on  05/09/2002.  Since discharge he had been having some right-sided sharp pains  that were described as butterflies with needles on them floating around in  my chest.  His symptoms were not associated with any exertion or meals.  He  went to [facility] and was transferred to [facility] for  further evaluation and treatment.   On 05/31/2002, a cardiac catheterization was performed, and it showed a  normal left main and an LAD with a 50% stenosis that included the first  diagonal.  The distal LAD had two 70% lesions. The circumflex was without  disease, but one of the ONs had a 70% lesion.  The RCA had a 30% proximal  stenosis and his previous CYPHER stent was widely patent.  The PLA had an  80% stenosis.  His EF was greater than 60% with no MR.   Dr. Chales Abrahams evaluated the films and felt that there was no clear culprit  lesion.  He felt that the symptoms were atypical and not necessarily  secondary to his coronary artery disease. He, therefore, felt that  outpatient evaluation with Cardiolite and follow up with Dr. Felicie Morn was  indicated.  If the Cardiolite is abnormal and in specific distribution then  possible percutaneous intervention is indicated.  Mr. Ladnier bedrest is  pending completion, but if this joint is stable with ambulation  he is  considered stable for discharge on 05/31/2002   LABORATORY VALUES:  Hemoglobin OF 14.5, hematocrit 42.8, WBC is 8.0;  platelets 203.  INR 1.1, PTT 31  Sodium 139, potassium 4.1, chloride 107,  CO2 27, BUN 14, creatinine 0.9, glucose 103.  Serial CK, MB, and troponin  are negative for MI.   Chest x-ray:  The lungs are clear.  No pneumothoraces or effusions are seen.  No acute disease.   DISCHARGE CONDITION:  Stable.   DISCHARGE DIAGNOSES:  1. Chest pain, no critical lesions by catheterization this admission.  2. Status post myocardial infarction on 05/07/2002 with CYPHER stent to the     left anterior descending.  3. Preserved left ventricular function with an ejection fraction greater     than 60% with no mitral regurgitation by catheterization this admission.  4. Hyperlipidemia.  5.  Hypertension.  6. History of prostatitis.  7. History of osteoarthritis.  8. History of depression and anxiety.  9. Adult onset diabetes mellitus.  10.      Family history of coronary artery disease.  11.      History of angioedema secondary to Accupril.  12.      Hypertension.   DISCHARGE INSTRUCTIONS:  His activity level is to include: No driving,  sexual or strenuous activity for 2 days.  He is to call the office for  problems with the cath site.  He is to stick to a low fat, diabetic diet.  He is to unassigned patient with a stress test at [facility] on  05/31 and see Dr. Phoebe Perch on 06/06.  He is to see Dr. Tomasa Blase as needed as  needed and he is to follow up at the [facility].   DISCHARGE MEDICATIONS:  1. Aspirin 325 mg daily.  2. Plavix 75 mg daily.  3. Serzone is not to be taken.  4. Pravachol 80 mg daily.  5. Toprol XL 25 mg daily.  6. Ambien and nitro as needed.  7. Flexor XR 150 mg daily prescription given.  8. Prilosec 20 mg daily.     Lavella Hammock, P.A. [facility]                  Thomas C. Wall, M.D.    RG/MEDQ  D:  05/31/2002  T:  05/31/2002  Job:  [id number]   cc:   Dr.  Fransico Him [facility]  [address]  [place]  [place] [address]  Fax: [phone]   [facility]   Veneda Melter, M.D.

## 2010-05-25 NOTE — Discharge Summary (Signed)
NAME:  REISS, MOWREY NO.:  [id number]   MEDICAL RECORD NO.:  [id number]                   PATIENT TYPE:  INP   LOCATION:  6522                                 FACILITY:  [facility]   PHYSICIAN:  Learta Codding, M.D.                 DATE OF BIRTH:  [birth year]   DATE OF ADMISSION:  06/10/2002  DATE OF DISCHARGE:  06/12/2002                           DISCHARGE SUMMARY - REFERRING   PROCEDURES:  Coronary angiogram/stent, obtuse marginal and PTCA left  anterior descending - June 4.   REASON FOR ADMISSION:  Please refer to dictated admission note.   LABORATORY DATA:  CBC normal at discharge.  Sodium 140, potassium 4.4,  glucose 123, BUN 9, creatinine 1.0 at discharge.  CPK 61/3.2 at discharge.   HOSPITAL COURSE:  Following the initial presentation to [facility]  because of recurrent chest pain and known coronary artery disease, patient  ruled out for myocardial infarction with negative serial cardiac enzymes.  However, given a recent negative adenosine Cardiolite, done just a few days  earlier, and patient's recurrent chest discomfort since a relook coronary  angiogram, recommendation was to transfer back to [facility] for  relook angiography and possible percutaneous intervention.   The following day, patient was taken back to the laboratory where he  underwent his third coronary angiogram by Dr. Chales Abrahams, in the recent past.  Old films were reviewed, and Dr. Chales Abrahams proceeded with successful stenting of  a 70% obtuse marginal lesion (TAXUS) as well as a PTCA dilatation of  multiple 70-80% distal/apical LAD lesions.  There were no noted  complications, and patient was kept for overnight observation.   Dr. Chales Abrahams recommended continuation of Plavix for at least 6 months.  He also  recommended the addition of a proton pump inhibitor.  Norvasc was increased  to 5 mg q.d., and Imdur was discontinued.   Patient was cleared for discharge the following  morning in hemodynamically  stable condition with no complaint of chest pain and stable right groin.   Of note, patient did report complaint of tachy palpitations in the recent  past.  Dr. Andee Lineman recommended consideration of an Event monitor to exclude  any underlying tachy arrhythmia.   MEDICATIONS AT DISCHARGE:  1. Plavix 75 mg q.d. (at least 6 months).  2. Enteric-coated aspirin 325 mg q.d.  3. Protonix 40 mg q.d.  4. Norvasc 5 mg q.d.  5. Toprol XL 50 mg q.d.  6. Pravachol 80 mg q.d.  7. Ambien 10 mg q.h.s.  8. Effexor 150 mg q.d.  9. Nitrostat 0.4 mg p.r.n.   INSTRUCTIONS:  1. Patient is to stop taking Imdur.  2. Refrain from heavy lifting/driving x2 days.  3. Maintain low-fat/cholesterol diet.  4. Call the office if there is any swelling/bleeding in the groin.  5. Patient was instructed to arrange follow up with Dr. Gilford Raid [facility] in  approximately 2 weeks.  6. Patient has also been instructed to discuss consideration of an Event     monitor at time of followup.   DISCHARGE DIAGNOSES:  1. Recurrent chest pain with known coronary artery disease.     a. Normal serial cardiac markers.     b. Status post stent (TAXUS) 70% obtuse marginal; PTCA 70-80%        distal/apical LAD lesions.     c. Status post non-stemi/stent (Cypher), RCA, April 2004, widely patent        by relook catheterization May 2004.  2. Tachycardic palpitations.  3. Hypertension.  4. Type 2 diabetes mellitus.  5. Dyslipidemia.       Gene Serpe, P.A. [facility]                      Learta Codding, M.D.    GS/MEDQ  D:  06/12/2002  T:  06/12/2002  Job:  [id number]   cc:   Gilford Raid Harsh  [address]  [place]  [place] [address]  Fax: [phone]

## 2010-05-25 NOTE — Cardiovascular Report (Signed)
NAME:  [name] [name], [name] [name] NO.:  [id number]   MEDICAL RECORD NO.:  [id number]                   PATIENT TYPE:  INP   LOCATION:  6522                                 FACILITY:  [facility]   PHYSICIAN:  Veneda Melter, M.D.                   DATE OF BIRTH:  August 02, 1945   DATE OF PROCEDURE:  06/11/2002  DATE OF DISCHARGE:                              CARDIAC CATHETERIZATION   PROCEDURES:  1. Selective coronary angiography.  2. Percutaneous transluminal coronary angioplasty and stent placement to the     marginal branch left circumflex artery.  3. Percutaneous transluminal coronary angioplasty and stent placement to the     distal and apical left anterior descending.   DIAGNOSES:  1. Coronary atherosclerotic disease  2. Unstable angina.   CARDIOLOGIST:  Veneda Melter, M.D.   HISTORY:  The patient is a 65 year old white male with history of coronary  disease who has previously undergone percutaneous intervention of the right  coronary artery.  The patient has persisted in having substernal chest  discomfort with radiation to the left arm occurring at rest.  He was  admitted to the [facility] and was subsequently transferred to [facility] for further care.  He has known residual disease of the left  circumflex and distal LAD and decision was made to proceed with percutaneous  intervention.   DESCRIPTION OF PROCEDURE:  Informed consent had been obtained.  The patient  was brought to the catheterization laboratory.  He had been pretreated with  aspirin and Plavix and he was given Angiomax on a weight adjusted basis.  A  7-French sheath was placed in the left femoral artery using modified  Seldinger technique.  A 7-French __________ guide catheter was used to  engage the left coronary artery and a 0.014 inch Forte wire advanced into  the large first marginal branch.  Selective guide shots were obtained.  This  confirmed the presence of irregular plaque of  70% in the proximal segment of  a large marginal branch.   A 3.0 x 20 mm TAXUS Express 2 stent was introduced, carefully positioned,  and deployed at 12 atmospheres x30 seconds.  A 3.0 x 15 mm Quantum Maverick  balloon was then used to postdilate the stent.  Two inflations were  performed at 12 atmospheres x30 seconds.  Repeat angiography after the  administration of intracoronary nitroglycerin showed an excellent result and  no residual stenosis, full coverage of the lesion, TIMI 3 flow through the  vessel.   The Forte wire was then repositioned in the LAD.  Selective guide shots were  obtained of the LAD.  This showed severe narrowing of 70% of the distal LAD  with further disease of 50 and 70% in the apical segment.  The Forte wire  was positioned distally and a 2.5 x 15 mm CrossSail balloon introduced.  Two  inflations were  performed at the apex at 6 atmospheres x60 seconds and an  additional seven inflations performed within the distal LAD at 8 atmospheres  x60 seconds.  Repeat angiography showed an excellent result with only mild  residual narrowing of less than 20%.  There was no evidence of vessel  damage.   Only after several prolonged balloon inflations, did the patient have  reproducible chest discomfort which he describes as similar to his pain on  presentation.  Final angiography was performed in various projections  showing no distal vessel damage and TIMI 3 flow through the LAD.  The guide  catheter was then removed and the sheath secured into position.  The patient  was enrolled in the Matrix closure study and was randomized to a standard  compression.  The sheath was secured in position.   He was transferred to the floor in stable condition.  The pain had resolved  at the termination of case.   FINAL RESULT:  1. Successful PTCA and stent placement to the marginal branch of the left     circumflex artery with reduction of 70% narrowing to 0% with placement of     a  3.0 x 20 mm Taxus Express 2 stent.  2. Successful PTCA of the distal and apical left anterior descending with     reduction of 70% narrowing to less than 20% using a 2.5 mm balloon.   ASSESSMENT/PLAN:  The patient is a 65 year old gentleman with diffuse three-  vessel coronary artery disease who has undergone percutaneous intervention  to the left circumflex artery and distal left anterior descending.  He had  previous intervention of the right coronary artery.  He has residual disease  in the small posterior descending artery and posterior ventricular branch.  Aggressive medical therapy should be pursued and percutaneous intervention  reserved for intractable symptoms.                                               Veneda Melter, M.D.    Melton Alar  D:  06/11/2002  T:  06/11/2002  Job:  [id number]   cc:   Paulina Fusi, M.D., [place]   Lesle Chris., M.D., [place], [place]

## 2010-05-25 NOTE — Cardiovascular Report (Signed)
NAME:  [name] [name], [name] [name]                          ACCOUNT NO.:  [id number]   MEDICAL RECORD NO.:  [id number]                   PATIENT TYPE:  INP   LOCATION:  3737                                 FACILITY:  [facility]   PHYSICIAN:  Veneda Melter, M.D.                   DATE OF BIRTH:  09/29/1945   DATE OF PROCEDURE:  05/07/2002  DATE OF DISCHARGE:  05/09/2002                              CARDIAC CATHETERIZATION   PROCEDURES PERFORMED:  1. Left heart catheterization.  2. Left ventriculogram.  3. Selective coronary angiography.  4. Percutaneous transluminal coronary angioplasty and stent placement of the     mid right coronary artery.   DIAGNOSES:  1. Coronary atherosclerotic disease.  2. Low normal left ventricular systolic function.  3. Inferior wall myocardial infarction.  4. Mitral regurgitation of 2+.   INDICATIONS:  The patient is a 65 year old white male with diabetes  mellitus, hypertension and dyslipidemia who presents with substernal left  chest discomfort.  The patient was admitted to [facility] where he  subsequently ruled in for myocardial infarction.  He had a loss of R-waves  inferiorly.  He was stabilized medically, and he was transferred to [facility] for further assessment.   TECHNIQUE:  Informed consent was obtained.  The patient was brought to the  catheterization lab.  A 6 French sheath was placed in the right femoral  artery using a modified Seldinger technique.  A 6 [place] and JR4  catheter was then used to engage the left and right coronary arteries, and  selective angiography was performed in various projections using manual  injections of contrast.  A 6 French pigtail catheter was then advanced to  the left ventricle and the left ventriculogram was performed using power  injections of contrast.   FINDINGS:  Initial findings are as follows:   1. Left main trunk is a large caliber vessel with mild irregularities.   1. Left anterior descending  is a large is a large caliber vessel that gives     rise to a small first diagonal branch in its proximal segment and then     extends to the apex.  The LAD has diffuse disease of 50% in the mid     section encompassing the takeoff of the first diagonal branch.  The     distal LAD has a narrowing of 60% prior to the apex.  There is then an     apical narrowing of 70%.  The first diagonal branch has moderate disease     of 50% in the proximal segment.   1. The left circumflex artery is a medium caliber vessel consisting of a     large marginal branch in the mid section.  This marginal branch has a     long narrowing of 70% in the proximal segment.  The AV circumflex has     moderate irregularities.  1. The ramus intermedius is a medium caliber vessel that bifurcates in its     mid section.  The ramus branch has mild disease of 30%.   1. The right coronary artery is a dominant medium caliber vessel that     supplies the posterior descending artery and several small posterior     ventricular branches in the terminal segment.  The right coronary artery     has mild disease of 30% in the proximal segment.  There is a severe     narrowing of 90% in the mid section after the LV marginal branch.  The     distal right coronary artery has mild irregularities.  The posterior     descending artery has a narrowing of 70% in the mid section.  There is a     further narrowing of 70% in the second posterior ventricular branch.   1. LEFT VENTRICULOGRAM:  Normal end-systolic and end-diastolic dimensions.     Overall left ventricular function is low normal.  Ejection fraction is     approximately 50%.  There is mild hypokinesis of the inferior wall.     There is 2+ mitral regurgitation noted.  LV pressure is 180/10, aortic is     170/90, LVEDP is 22.   PLAN:  These findings were reviewed with the patient.  We elected to proceed  with percutaneous intervention to the right coronary artery.    DESCRIPTION OF PROCEDURE:  The patient had been pretreated with aspirin,  Plavix and Angiomax and was given Integrelin bolus on a weight-adjusted  basis per the [employer] Protocol.  A 6 [place] guide catheter was used to  engage the right coronary artery and 0.14 inch Forte wire advanced in the  distal vessel.  Further angiography was performed to size the lesion and the  vessel diameter.  A 3.0 x 20 mm Quantum Maverick balloon was then introduced  and used to predilate the lesion at 8 atmospheres for 30 seconds.  A 3.0 x  23 mm Cypher stent was then introduced and carefully positioned in the mid  RCA and deployed at 12 atmospheres for 30 seconds.  The 3.0 mm Quantum  Maverick balloon was then used to dilate a distal section of the stent at 14  atmospheres for 30 seconds in the proximal segment at 16 atmospheres for 30  seconds.  Repeat angiography was then performed after the administration of  intracoronary nitroglycerin showing excellent result with no residual  stenosis, full coverage of the lesion and no distal vessel damage.  There  was TIMI-3 flow to the RCA.  The guide catheter was then removed.  The  sheath was secured into position.  The patient tolerated the procedure well  and was transferred to the floor in stable condition.   FINAL RESULTS:  Successful percutaneous transluminal coronary angioplasty  and stent placement in the mid right coronary artery with reduction of 90%  narrowing to 0% with placement of a 3.0 x 23 mm Cypher stent.                                               Veneda Melter, M.D.    Melton Alar  D:  05/07/2002  T:  05/09/2002  Job:  [id number]   cc:   [place] Bing, M.D.   Shiv Harsh  [address]  [place]  [place] [address]  Fax: [phone]

## 2010-05-25 NOTE — H&P (Signed)
NAME:  [name] [name], [name] [name] NO.:  [id number]   MEDICAL RECORD NO.:  [id number]                   PATIENT TYPE:  INP   LOCATION:  4727                                 FACILITY:  [facility]   PHYSICIAN:  Salvadore Farber, M.D.             DATE OF BIRTH:  [birth year]   DATE OF ADMISSION:  05/28/2002  DATE OF DISCHARGE:                                HISTORY & PHYSICAL   CHIEF COMPLAINT:  Chest pain.   HISTORY OF PRESENT ILLNESS:  The patient is a 65 year old gentleman, status  post inferior myocardial infarction in April of 2004, treated with drug-  eluting stent of the mid RCA.  At that time, he was noted to have moderate  disease in the left system and a 90% distal RCA lesion.  LV systolic  function was overall preserved.  Since discharge, he has had multiple  episodes of chest discomfort which he describes as like butterflies with  needles on them fluttering around in my chest.  These typically last for  seconds to up to 15 minutes.  He has taken sublingual nitroglycerin on a few  occasions with resolution within a few minutes after taking it.  However, he  has had similar resolution without taking any nitroglycerin.  He has been  active in cardiac rehabilitation and had none of these discomforts with  exertion.  He further denies any palpitations, exertional dyspnea,  orthopnea, PND, edema, presyncope and syncope.   PAST MEDICAL HISTORY:  1. CAD, as above.  2. Dyslipidemia.  3. Hypertension.  4. Prostatitis.  5. Status post surgeries on left knee and right shoulder.  6. Depression and anxiety.  7. Diabetes mellitus.   ALLERGIES:  ACE INHIBITORS cause angioedema.   CURRENT MEDICATIONS:  1. Aspirin 325 mg per day.  2. Plavix 75 mg per day.  3. Serzone 100 mg twice per day.  4. Pravachol 80 mg per day.  5. Ambien 10 mg h.s.  6. Toprol-XL 25 mg per day.   SOCIAL HISTORY:  The patient was in [place] with his wife.  He works taking  care of mentally  disabled people.  Denies tobacco and alcohol use.   FAMILY HISTORY:  Mother died at 74 of cancer.  Father died at 87 during a  redo CABG.  Two siblings are alive and well.   REVIEW OF SYSTEMS:  Seasonal allergies, wears reading glasses, arthralgias  in knees and shoulder, GERD symptoms.  Otherwise, negative in detail except  as above.   PHYSICAL EXAMINATION:  GENERAL:  On physical exam, this is a well-appearing  man in no distress.  VITAL SIGNS:  Heart rate 70, blood pressure 126/61, temperature 100.3,  weight 222 pounds, oxygen saturation 97% on 2 L.  NECK:  He has no jugular venous distention.  LUNGS:  Lungs are clear to auscultation.  CARDIAC:  He has a regular rate and rhythm without murmur, rub, or  gallop.  ABDOMEN:  The abdomen is soft, nondistended, and nontender.  There is no  hepatosplenomegaly.  Bowel sounds are normal.  EXTREMITIES:  Extremities are warm without clubbing, cyanosis, edema or  ulceration.  PERIPHERAL VASCULAR:  Carotid pulses are 2+ bilaterally without bruits.  Femoral pulses are 2+ bilaterally without bruits.  Dorsalis pedis and PT  pulses are 2+ bilaterally.   LABORATORY AND ACCESSORY CLINICAL DATA:  1. Chest x-ray demonstrates question of mild cardiomegaly.  No CHF.  Normal     mediastinum.  2. Electrocardiogram demonstrates normal sinus rhythm at 66 beats per     minute.  There is incomplete right bundle branch block, Q wave in lead     III alone.  3. Laboratory studies remarkable for hematocrit 41, platelets 217,000, WBC     5.7, sodium 139, potassium 4.4, creatinine 1.0, glucose 79, AST 26, ALT     30, CK-MB 481, troponin I 0.1 (this lab test obtained at [facility]; anything less than 0.5 is negative per their laboratory).   IMPRESSION/RECOMMENDATIONS:  [age]-year-old gentleman, one month  status post inferior myocardial infarction, with moderate scattered plaques  throughout the remainder of the coronary system, now presents with  very  atypical chest discomfort.  He was transferred by Dr. Gilford Raid Harsh for cardiac  catheterization.  We will continue his current medications and plan on  catheterization on Monday.  Anticoagulation with low-molecular-weight  heparin will be initiated, only should he have more long-lasting chest pain,  electrocardiographic abnormality, or positive enzymes.                                               Salvadore Farber, M.D.    WED/MEDQ  D:  05/28/2002  T:  05/29/2002  Job:  [id number]   cc:   Joellen Jersey M.D.   [place], [place] Barney Drain M.D.

## 2010-05-25 NOTE — Discharge Summary (Signed)
[place]  Patient:    [name] [name], [name] [name]                       MRN: [id number] Adm. Date:  [id number] Disc. Date: [id number] Attending:  Milly Jakob Dictator:   Currie Paris. Thedore Mins.                           Discharge Summary  ADMISSION DIAGNOSES: 1. Degenerative joint disease, left knee involving patellofemoral joint    and medial compartment. 2. Hypertension. 3. History of diabetes mellitus, both currently stable without medication.  DISCHARGE MEDICATIONS: 1. Degenerative joint disease, left knee involving patellofemoral joint    and medial compartment. 2. Hypertension. 3. History of diabetes mellitus, both currently stable without medication.  PROCEDURE:  Left total knee arthroplasty with cemented components.  BRIEF HISTORY:  [name] [name] is a pleasant 65 year old male who has a long history of left knee pain.  Standing x-rays showed bone-on-bone in the medial compartment of the left knee.  He had knee arthroscopy in October of 2001 which showed marked medial compartment arthritis with some patellofemoral changes.  He was initially felt to possibly be a candidate for a unicompartmental knee replacement, but upon evaluation in the office, he was noted to have significant patellofemoral pain, and our concern was that a unicompartment prosthesis alone would not be beneficial for him since he had patellofemoral arthritis as well as medial compartment arthritis.  He had pain with weightbearing and positive night pain in the left knee and he got no response with conservative treatment including injections, previous arthroscopy, and modification of his activities.  For this reason, he was admitted for a left total knee arthroplasty.  LABORATORY DATA:  Hemoglobin on admission was 13.9, hematocrit 40.6, WBC 8.2. On postoperative day #1, hemoglobin 12.4, postoperative day #2 11.1, postoperative day #3 10.2.  Incision was within normal limits.   Pro time on admission was 14.1 with INR of 1.1, PTT 31.  On day of discharge pro time was 21.4 seconds, INR of 2.3.  CMET on admission was within normal limits. Urinalysis on admission was normal.  HOSPITAL COURSE:  The patient underwent left total knee arthroplasty as well described in Dr. Luiz Blare operative note.  Postoperatively, he was placed on PCA morphine pump for pain control, IV Ancef 1 gram q.8h. x 2 days.  Physical therapy was ordered for walker ambulating weightbearing as tolerated on the left with a knee immobilizer.  CPM machine was used for range of motion.  On postoperative day #1, he had appropriate pain in the knee.  His vital signs was stable, he was afebrile.  MB was intact distally.  His INR was 1.3 and hemoglobin was 13.9.  The morphine was changed to OxyContin as he was not getting good pain relief.  Dilaudid was actually also added to his IV pain medication regimen.  On postoperative day #2 he had no significant complaints. He had decreasing knee pain.  He was taking p.o. and voiding without difficulty.  He had been up to the chair.  He had a temperature of 100.9.  His left knee wound was benign.  Calf was soft and MB was intact distally.  His hemoglobin was 11.1, INR was 1.9.  His dressing was changed and hemovac drains were pulled.  The IV was converted to a saline lock.  He was continued with physical therapy.  On July 31, 2000,  he had a temperature of 102.  His INR was 1.3, hemoglobin was 9.6.  He was somewhat slow in his progress with physical therapy and did have a fever.  He was continued on oxygen and needed to get his fever under control prior to discharge home.  On postoperative day #4 he had no complaints and was doing well with physical therapy.  He was taking fluids and voiding without difficulty.  His vital signs were stable and he was afebrile.  His INR was 2.4.  He was discharged home in improved condition on a regular diet.  He was given Percocet #40  with no refills p.r.n. for pain, Coumadin 5 mg #30 one q.d. unless otherwise directed x 1 month for DVT prophylaxis.  He will have Home Health physical therapy and Home Health RNs for pro times.  Home CPM machine will also be used for range of motion of the left knee.  He will ambulate weightbearing as tolerated on the left with a walker.  Follow up by Dr. Luiz Blare in two weeks or sooner if any problems occur. D:  08/18/00 TD:  08/18/00 Job: [id number]

## 2010-05-25 NOTE — Op Note (Signed)
[place]. [facility]  Patient:    [name] [name], [name] [name]                       MRN: [id number] Proc. Date: 07/28/00 Adm. Date:  [id number] Attending:  Milly Jakob                           Operative Report  PREOPERATIVE DIAGNOSIS:  Degenerative joint disease, left knee.  POSTOPERATIVE DIAGNOSIS:  Degenerative joint disease, left knee.  OPERATION PERFORMED:  Left total knee arthroplasty with a Depuy LCS instrumentation system.  A large plus femur, size 5 keeled tibia, 10 mm polyethylene insert and a large plus patella.  SURGEON:  Harvie Junior, M.D.  ASSISTANT:  Currie Paris. Thedore Mins.  ANESTHESIA:  General.  INDICATIONS FOR PROCEDURE:  The patient is a 65 year old male with a long history of bilateral degenerative joint disease.  He ultimately had both of his knees scoped.  He had significant medial compartment disease bilateral knees.  We had initially thought about doing a unicompartmental knee replacement but on his preoperative evaluation, he was felt to have fairly significant patellofemoral complaints of pain with pain after sitting for a long period of time with pain with going up and down stairs.  Because of the continued complaints of pain and concern with the possible patellofemoral problems, we discussed the possibility of unicompartmental knee replacement versus complete knee replacement and it was his feeling that he would prefer to proceed with the total knee replacement as opposed to the unicompartmental knee replacement and he was brought to the operating room for this procedure.  DESCRIPTION OF PROCEDURE:  The patient was taken to the operating room and after adequate anesthesia was obtained with general anesthetic, the patient was placed supine on the operating table.  The left  leg was prepped and draped in the usual sterile fashion. Following this, the leg was exsanguinated and a blood pressure tourniquet was inflated to 350 mmHg.   Following this, a midline incision was made and subcutaneous tissues were dissected down to the level of the extensor mechanism.  A medial parapatellar arthrotomy was undertaken and the medial and lateral meniscus were removed as well as patellar fat pad.  Following this, a cut was made of the proximal tibia after access into the intramedullary canal.  A pilot hole was made and the proximal tibia was cut perpendicular to the long axis of the tibia.  Alignment guides were used to ensure this. Following this, attention was turned toward the central portion of the femur where a large plus seemed to be the most appropriate size for the knee and a large plus guide was then used and the alignment guide 4 degree valgus.  Anterior and posterior cuts were made with the preset flexion gap and once this was undertaken, then the anterior and posterior cuts were made.  Following this, 4 degree valgus distal cutting block was used and the distal cuts were made.  Following this, the Chamfer block was put in place.  The Chamfer cuts were made and posterior cuts were made.  At this point the tibia was sized.  A size 5 was appropriate.  It was centered and implants were put in place.  Following this, the knee was put through a range of motion with trial components in place.  Excellent tracking was achieved.  At this point the patella was cut to its appropriate size,  down 12 mm from a 26 down to a 14 mm patella.  Once this was achieved, the three-peg patella was put in place.  The trial patella was put in place and the knee was put through a range of motion.  Excellent alignment was achieved. At this point the components were all removed.  The knee was copiously irrigated with pulsatile lavage with suction irrigation and the three components were then cemented into place in a standard fashion.  All excess cement was removed and the knee was then trialed through a range of motion. Excellent alignment was  achieved.  The medial parapatellar arthrotomy was then closed with a #1 Vicryl running suture.  The subcutaneous was then closed with 0 and 2-0 Vicryl and the skin with skin staples.  A medium Hemovac drain was placed prior to closure.  A sterile compressive dressing was then applied. The patient was taken to the recovery room where she was noted to be in satisfactory condition.  Estimated blood loss for this procedure was none.  COMPLICATIONS:  None. DD:  07/28/00 TD:  07/29/00 Job: [id number]

## 2010-05-25 NOTE — Op Note (Signed)
[place]. [facility]  Patient:    [name] [name], [name] [name]                       MRN: [id number] Proc. Date: 04/25/00 Adm. Date:  [id number] Attending:  Milly Jakob                           Operative Report  PREOPERATIVE DIAGNOSIS:  Degenerative disk disease, right knee, with medial meniscal tear.  POSTOPERATIVE DIAGNOSES: 1. Degenerative disk disease, right knee, with medial meniscal tear. 2. Anterior horn lateral meniscal tear. 3. Degenerative changes of patellofemoral joint.  PROCEDURES: 1. Partial medial meniscectomy with debridement of medial compartment,    including exposed bone on tibial plateau and grade 3 changes on medial    femoral condyle. 2. Debridement of small anterior horn lateral meniscal tear, minimal    degenerative changes lateral femoral compartment, lateral tibial plateau. 3. Debridement of medial shelf plica. 4. Debridement of minimal chondromalacia of patellofemoral joint.  SURGEON:  Harvie Junior, M.D.  ASSISTANT:  Currie Paris. Thedore Mins.  ANESTHESIA:  General.  BRIEF HISTORY:  This is a 65 year old male with a long history of bilateral degenerative joint disease in the knee.  He ultimately had a left knee arthroscopy that took his pain from a level of 10 to a level of 4.  He began having intermittent catching and locking symptoms on his right knee and because of persistent pain in the right knee, he is ultimately brought to the operating room for evaluation of that knee and arthroscopy.  DESCRIPTION OF PROCEDURE:  Patient brought to the operating room and after adequate anesthesia was obtained with a general anesthetic, the patient was placed supine on the operating table.  The right leg was then prepped and draped in the usual sterile fashion.  Following this, routine arthroscopic examination of the knee revealed that there was obvious complex tear of the posterior horn of the medial meniscus.  This was debrided back to a  stable rim, and the remaining meniscal rim was contoured down with a suction shaver. A combination of straight-biting, upbiting, and right hand side-biting forceps were used to resect the meniscal tear.  Attention was then turned to the medial tibial plateau, where there was noted to be an area of exposed bone in the medial compartment with surrounding significant chondromalacia.  This was debrided with a suction shaver.  The medial femoral condyle had grade 3 changes, was debrided.  Attention was then turned laterally, where the lateral compartment looked reasonable; however, there was a small anterior horn lateral meniscal tear, certainly not that significant, but was debrided with a suction shaver.  Attention was then turned to the lateral compartment, where there were noted to be some mild degenerative changes but certainly not any full-thickness or cartilaginous injury areas.  Attention was turned to the patellofemoral joint, where again there were some mild degenerative changes, which were debrided back to stable rim, but nothing that significant.  At this point there was a large medial shelf plica and a roughened area on the medial femoral condyle.  The plica was excised.  It did need to have a biting forceps to excise this plica because it was such a significant tissue band.  At that point, the knee was copiously irrigated and suctioned dry.  The arthroscopic portals were closed with a bandage.  A sterile compressive dressing was applied.  The patient was  taken to the recovery room, where she was noted to be in satisfactory condition.  Estimated blood loss for the procedure was none. DD:  04/25/00 TD:  04/26/00 Job: [id number]

## 2010-05-25 NOTE — Discharge Summary (Signed)
NAME:  [name] [name], [name] [name] NO.:  [id number]   MEDICAL RECORD NO.:  [id number]                   PATIENT TYPE:  INP   LOCATION:  3737                                 FACILITY:  [facility]   PHYSICIAN:  [place] Bing, M.D.               DATE OF BIRTH:  1945/06/26   DATE OF ADMISSION:  05/07/2002  DATE OF DISCHARGE:  05/09/2002                           DISCHARGE SUMMARY - REFERRING   PROCEDURES:  1. Cardiac catheterization.  2. Coronary arteriogram.  3. Left ventriculogram.  4. Percutaneous transluminal coronary angioplasty and Cypher stent to one     vessel.   HOSPITAL COURSE:  The patient is a 66 year old male with no known history of  coronary artery disease.  He went to [facility] on May 06, 2002 for  chest pain.  His pain had woken him at approximately 4 a.m. and lasted 2-3  hours.  He had repeat episodes at noon and 6 p.m. and went to the emergency  room where he ruled in for an MI.  He received nitroglycerin at [facility] and was pain free after that.  He was transferred to [facility] for further evaluation and treatment.   The patient had a cardiac catheterization performed and it showed a normal  left main and LAD with a 50% proximal lesion, as well as a 60%-70% distal  lesion.  The first diagonal had a 50% stenosis.  The circumflex was patent  with a 70% stenosis in the OM.  The RCA had a 30% proximal lesion and a 90%  mid distal lesion.  The PDA and the PLA each had a 70% stenosis.  His EF was  60% with 2+ MR.  He had mild inferior hypokinesis.  He had PTCA and a Cypher  stent to the mid RCA reducing the stenosis from 90% to 0% with TIMI-3 flow.  He tolerated the procedure well.   The patient had been taking Zocor prior to admission at 10 mg a day and  Serzone 100 mg b.i.d.  The pharmacy evaluated his medications and  substituted Pravachol for Zocor.  The Zocor dose had been increased to 40 mg  a day once he ruled in  for an MI.  It was felt that an appropriate  substitution was Pravachol 80 mg a day to avoid interaction with his  Serzone.  The patient was tolerating the Pravachol well.   The patient had borderline hypertension with a blood pressure in the 130s  and 140s.  He had Toprol XL 25 mg daily added to his medication regimen and  tolerated this well.   The patient had been recently diagnosed with prostatitis and was completing  his antibiotics.  He was on Cipro 500 mg b.i.d. and he was given medication  through the ending date of his prescription and then the medication was  stopped.   By May 09, 2002, the patient  was ambulating without chest pain or shortness  of breath.  He was considered stable for discharge on May 09, 2002 with  outpatient followup arranged.   LABORATORY VALUES:  Sodium 138, potassium 3.7, chloride 104, CO2 29, BUN 10,  creatinine 0.9, glucose 104.  Hemoglobin 13.3, hematocrit 39.1, WBC 11.3,  platelets 390.  Total cholesterol 122, triglycerides 185, HDL 24, LDL 61.   DISCHARGE CONDITION:  Improved.    DISCHARGE DIAGNOSES:  1. Acute inferior myocardial infarction, status post Cypher stent to the     right coronary artery this admission.  2. Residual disease in the diagonal, left anterior descending, and obtuse     marginal as well as posterior descending artery and posterolateral artery     at 50%-70%.  3. Preserved left ventricular function with an ejection fraction of     approximately 50% and 2+ mitral regurgitation by catheterization this     admission.  4. Depression.  5. Osteoarthritis.  6. Recent prostatitis.  7. Family history of coronary artery disease.  8. Status post total knee replacement on the left and right knee surgery, as     well as right shoulder surgery.  9. Allergy to ACCUPRIL.   DISCHARGE INSTRUCTIONS:  1. His activity level is to include no driving for five days and no work or     strenuous activity until seen by M.D.  2. He is to stick  to a diet that is low in fat and sugar.  3. He is to call the office for problems with the catheterization site.  4. He is to follow up with Dr. Tomasa Blase as needed and he is to follow up with     Dr. Sylvie Farrier next week.   DISCHARGE MEDICATIONS:  1. Coated aspirin 325 mg daily.  2. Plavix 75 mg daily.  3. Serzone 100 mg b.i.d.  4. Pravachol 80 mg daily.  5. Ambien 10 mg q.h.s.  6. Toprol XL 25 mg daily.  7. Nitroglycerin p.r.n.  8. Arthritis pill as prior to admission.  9. He is not to take Zocor for now.     Lavella Hammock, P.A. [facility]                  [place] Bing, M.D.    RG/MEDQ  D:  05/09/2002  T:  05/09/2002  Job:  [id number]   cc:   Janace Litten, M.D.   [facility]  [address]  [place]  [place] [address]  Fax: [phone]

## 2010-05-25 NOTE — Cardiovascular Report (Signed)
NAME:  [name] [name], [name] [name]                          ACCOUNT NO.:  [id number]   MEDICAL RECORD NO.:  [id number]                   PATIENT TYPE:  INP   LOCATION:  4727                                 FACILITY:  [facility]   PHYSICIAN:  Veneda Melter, M.D.                   DATE OF BIRTH:  1945-09-15   DATE OF PROCEDURE:  05/31/2002  DATE OF DISCHARGE:                              CARDIAC CATHETERIZATION   PROCEDURES:  1. Left heart catheterization.  2. Left ventriculogram.  3. Selective coronary angiography.   DIAGNOSES:  1. Coronary atherosclerotic disease.  2. Normal left ventricular systolic function.   HISTORY:  The patient is a 65 year old white male with a history of diabetes  mellitus, hypertension, dyslipidemia, and coronary disease, who suffered an  inferior wall myocardial infarction in April 2004.  On 05/07/02 he underwent  percutaneous intervention and stent placement to the mid-right coronary  artery.  He has continued to have some intermittent episodes of substernal  chest discomfort.  These appear to be somewhat atypical in nature.  They are  not associated with exertion.  They have been associated with some shortness  of breath and subjective diaphoresis.  He has taken some nitroglycerin with  partial relief.  He also knows that the pain occurs approximately one hour  after meals and is relieved with no intervention at all on occasion.  He was  hospitalized and stabilized for recurrent pains and is transferred to [facility] for further assessment.   DESCRIPTION OF PROCEDURE:  Informed consent was obtained.  The patient was  brought to the catheterization lab.  A 6 French sheath placed in his right  femoral artery using the modified Seldinger technique.  A 6 [place] and  JR4 catheter were used to engage the left and right coronary arteries and  selective angiography performed in various projections using intravenous  contrast.  A 6 French pigtail catheter was then  advanced to the left  ventricle and a left ventriculogram performed using power injections of  contrast.  At the termination of the case the catheters and sheath were  removed and manual pressure applied until adequate hemostasis was achieved.  The patient tolerated the procedure well and was transferred to the floor in  stable condition.   FINDINGS:  1. The left main trunk is a medium-caliber vessel with mild irregularities.  2. The LAD:  A medium-caliber vessel that provides a diagonal branch     proximally.  The LAD has moderate narrowing of 50% in the proximal     segment encompassing the takeoff of the first diagonal branch.  The     distal LAD has sequential narrowing of 70 and 80% prior to the apex.  The     diagonal branch has diffuse tubular disease of 50% in the proximal     segment.  3. Left circumflex artery:  This is a  medium-caliber vessel consisting of a     large marginal branch in the midsection.  This marginal branch has a long     eccentric narrowing of 70% in the proximal segment.  4. Ramus intermedius:  This is a small-caliber vessel that bifurcates in the     midsection.  The ramus branch has diffuse disease of 30-40%.  5. Right coronary artery:  Dominant, a medium-caliber vessel, supplies the     posterior descending artery and two posterior ventricular branches in the     terminal segment.  The right coronary artery has moderate narrowing of     30% in the proximal midsection.  There is a previously-placed stent after     the RV marginal branch that is widely patent with only mild in-stent     narrowing of less than 10%.  The distal RCA has moderate irregularity as     well.  The posterior descending artery has a focal high-grade narrowing     of 80% in the midsection.  The posterior ventricular branch has a     narrowing of 70% in its segment.  These are small-caliber vessels of less     than 2 mm.  6. LV:  Normal left ventricular systolic and end-diastolic  dimensions.     Overall left ventricular function is well-preserved, ejection fraction is     greater than 60%, no mitral regurgitation.  LV pressure is 160/0, aortic     is 160/70, LVEDP equals 20.   ASSESSMENT AND PLAN:  The patient is a 65 year old gentleman with substernal  chest discomfort of unclear etiology.  The patient's pain has some atypical  features that are not clearly cardiac in nature.  Furthermore, on  angiography he has several potential areas for ischemia and it is unclear  what the culprit lesion is at this point.  Will thus proceed with a stress  imaging study to determine if any ischemia can be demonstrated.  This may  help guide percutaneous intervention.  If not, continued medical therapy  will be pursued and a gastric cause of discomfort and should he have  continued pain, percutaneous intervention may be warranted for relief of  symptoms.                                                  Veneda Melter, M.D.    NG/MEDQ  D:  05/31/2002  T:  05/31/2002  Job:  [id number]   cc:   Dr. Joanne Gavel Harsh  [address]  [place]  [place] [address]  Fax: [phone]

## 2010-05-25 NOTE — H&P (Signed)
NAME:  [name] [name], [name] [name] NO.:  [id number]   MEDICAL RECORD NO.:  [id number]                   PATIENT TYPE:  INP   LOCATION:  4705                                 FACILITY:  [facility]   PHYSICIAN:  Learta Codding, M.D.                 DATE OF BIRTH:  [birth year]   DATE OF ADMISSION:  06/10/2002  DATE OF DISCHARGE:                                HISTORY & PHYSICAL   REFERRING PHYSICIAN:  Dr. Sylvie Farrier in [place].   PRIMARY CARE PHYSICIAN:  Dr. Tomasa Blase.   CHIEF COMPLAINT:  Chest pain and palpitations.   HISTORY OF PRESENT ILLNESS:  The patient is a 65 year old white male with  known coronary artery disease.  The patient is status post acute ST  elevation myocardial infarction in April 2004.  He underwent stent placement  with a drug eluding stent to the right coronary artery.  He did have  significant residual disease with 50% mid and 60% distal left anterior  descending lesion, as well as a 50% diagonal lesion.  Also had disease in  the circumflex which was 100% in the obtuse marginal branch and 70% in the  posterior descending artery on the right coronary artery.  Ejection fraction  was 50% with 2+ mitral regurgitation.  At the time, the patient presented  with right shoulder pain which was a rather atypical presentation.  Subsequently to this, the patient had a re-look catheterization on May 31, 2002, due to recurrence of chest pain.  Stent was widely patent.  He had no  progressive coronary artery disease.  The patient underwent a stress  Cardiolite study which reportedly was within normal limits.  He had failed a  prior exercise treadmill test due to shortness of breath and fatigue.  The  patient is now referred from Dr. Sylvie Farrier after he presented last night with  another episode of severe anterior substernal chest burning.  The patient  felt that he was on fire, and developed a severe headache.  He became  extremely diaphoretic.  Interestingly, he also  reported that throughout  these episodes significant palpitations, felt that his heart was racing.  He  took his heart rate and clocked it at 145 beats per minute.  He took three  nitroglycerins without any significant relief.  By the time the paramedics  got to his house he felt that he had cooled down, and he had no further  symptoms.  The patient was then admitted and has ruled out for myocardial  infarction.  He is now referred for repeat cardiac catheterization as  discussed between Dr. Chales Abrahams and Dr. Sylvie Farrier.   ALLERGIES:  ACCUPRIL causes facial swelling.   MEDICATIONS:  1. Plavix 75 mg daily.  2. Toprol 50 mg daily.  3. Pravachol 80 mg daily.  4. Ambien 10 mg p.o. q.h.s.  5. Enteric coated aspirin 325 mg p.o. daily.  6. Imdur  30 mg p.o. daily.  7. Norvasc 2.5 mg p.o. daily.  8. Effexor 150 mg p.o. daily.  9. Prilosec over-the-counter.   PAST MEDICAL HISTORY:  1. Coronary artery disease, status post non-ST elevation myocardial     infarction.  Cypher stent for a 90% mid right coronary artery lesion in     April 2004.  Residual disease as outlined above.  Re-look catheterization     on May 28, 2002 was widely patent stent.  2. __________ surgery in September 2003.  3. __________ in July 2002, secondary to degenerative joint disease.   SOCIAL HISTORY:  The patient lives in __________ with his wife.  He is  currently unemployed.  He is married, has two children.  He never smokes.  He does not drink alcohol.   FAMILY HISTORY:  Notable for father died at age 48 of a myocardial  infarction during a redo coronary artery bypass grafting.   REVIEW OF SYMPTOMS:  No fever or chills, no headaches or shortness of  breath, dyspnea on exertion.  Positive for palpitations.  No frequency or  dysuria.  No weakness or numbness.  No myalgias, nausea, or vomiting.   PHYSICAL EXAMINATION:  VITAL SIGNS:  Blood pressure 132/78, heart rate 64  beats per minute, respiratory rate 20, saturation 98%  on room air.  GENERAL:  A 65 year old male in no acute distress.  HEENT:  Pupils equal, round, reactive to light and accommodation.  Extraocular movements were intact.  NECK:  Supple.  No carotid bruits.  HEART:  Regular rate and rhythm, normal S1 and S2.  Soft systolic murmur at  the left upper sternal border.  LUNGS:  Clear.  ABDOMEN:  Soft, nontender, no rebound or guarding.  GENITOURINARY:  Deferred.  RECTAL:  Deferred.  EXTREMITIES:  No cyanosis, clubbing, or edema.  Previous right femoral  artery site looks within normal limits.   LABORATORY DATA:  Chest x-ray showed no acute changes.  EKG shows sinus  bradycardia at __________ beats per minute.  No acute ischemic changes.   Hemoglobin 15.9, hematocrit 45.7, white blood cell count 8.3, platelet count  316.  Sodium 142, potassium 4.2, BUN 14, creatinine 0.9.  CK and CK-MB  negative x3 with a troponin-I negative x2.  Negative myoglobin.   IMPRESSION AND PLAN:  1. Rule out acute coronary syndrome.  The patient presents with recurrence     of substernal chest pain, although his symptoms are very atypical.  His     predominant feature to this episode was actually feeling hot all over as     well as palpitations.  With his prior non-ST elevation myocardial     infarction he only had right shoulder pain, but did not really have any     chest symptoms.  The patient has ruled out for myocardial infarction.  As     discussed between Dr. Chales Abrahams and Dr. Sylvie Farrier, the plan is to repeat with     cardiac catheterization, although I doubt that there will be any new     pathology or instent restenosis.  The patient appears to be optimized     from medical therapy.  2. Palpitations.  This may well be the patient's cardinal symptom.  His     heart rate was 145 beats per minute during this episode and did not have     any relief with nitroglycerin.  I am concerned that the patient could    have an underlying arrhythmia, but also atrial flutter  or  atrial     fibrillation.  If his cardiac catheterization does not show any new     findings, the patient definitely will need to proceed with an event     monitor and may a further EP evaluation.    DISPOSITION:  Proceed with cardiac catheterization tomorrow, and plan on  outpatient event monitor if no arrhythmias are documented during this  hospitalization.                                               Learta Codding, M.D.    GED/MEDQ  D:  06/10/2002  T:  06/10/2002  Job:  [id number]   cc:   Dr. Sylvie Farrier in [place]   Dr. Tomasa Blase

## 2010-05-25 NOTE — Op Note (Signed)
[place]. [facility]  Patient:    KNOWLEDGE, ESCANDON Visit Number: [id number] MRN: [id number]          Service Type: DSU Location: [facility] Attending Physician:  Milly Jakob Admit Date:  10/17/1999                             Operative Report  This is a 65 year old male _____ on 10/17/99.  PREOPERATIVE DIAGNOSIS:  Medial meniscal tear with femoral condylar chondral injury.  POSTOPERATIVE DIAGNOSIS:  Medial meniscal tear with femoral condylar chondral injury.  PROCEDURE: 1. Debridement of medial meniscal tear. 2. Medial femoral condylar chondroplasty. 3. Trochlea chondroplasty.  SURGEON:  Graves.  ASSISTANT:  _____.  HISTORY OF PRESENT ILLNESS:  This is a 65 year old male with a long history of having medial joint line tenderness.  He has had a lot of popping and giving out and _____ MRI, which showed that he had a large posterior horn medial meniscus.  We talked to him about types of treatment, operative versus nonoperative, and ultimately felt that he would do best with operative treatment.  He was brought to the operating room for this procedure.  DESCRIPTION OF PROCEDURE:  He was taken to the operating room and adequate anesthesia was obtained with a general anesthetic, the patient was placed supine on the operating table.  The left leg was then prepped and draped in the usual sterile fashion.  Following this, routine arthroscopic examination of the knee revealed that there was an obvious large posterior horn medial meniscal tear with a complex portion going around posteriorly and anteriorly. The medial femoral condyle did have significant areas of chondral injury. This was debrided with the _____ shaver back to a stable rim with no chondral crack or fissure.  At this point, the scope was turned to the medial side to make sure the medial meniscus was not _____  and attention was then turned to the posterior horn.  At this point attention was  turned laterally to evaluate and close _____.  The anterior cruciate ligament was normal.  The patellofemoral area was then debrided, and the trochlea was _____ and then the knee was thoroughly irrigated and suctioned dry.  The portals were closed with Steri-Strips and a sterile compressive dressing was applied, and the patient was taken to the recovery room and _____ the procedure.  Attending Physician:  Milly Jakob DD:  10/17/99 TD:  10/18/99 Job: [id number]

## 2011-01-11 DIAGNOSIS — M5126 Other intervertebral disc displacement, lumbar region: Secondary | ICD-10-CM | POA: Diagnosis not present

## 2011-01-14 DIAGNOSIS — M5126 Other intervertebral disc displacement, lumbar region: Secondary | ICD-10-CM | POA: Diagnosis not present

## 2011-01-14 DIAGNOSIS — M47817 Spondylosis without myelopathy or radiculopathy, lumbosacral region: Secondary | ICD-10-CM | POA: Diagnosis not present

## 2011-01-16 DIAGNOSIS — K5730 Diverticulosis of large intestine without perforation or abscess without bleeding: Secondary | ICD-10-CM | POA: Diagnosis not present

## 2011-01-16 DIAGNOSIS — M76899 Other specified enthesopathies of unspecified lower limb, excluding foot: Secondary | ICD-10-CM | POA: Diagnosis not present

## 2011-01-16 DIAGNOSIS — M25559 Pain in unspecified hip: Secondary | ICD-10-CM | POA: Diagnosis not present

## 2011-01-16 DIAGNOSIS — M25469 Effusion, unspecified knee: Secondary | ICD-10-CM | POA: Diagnosis not present

## 2011-01-16 DIAGNOSIS — N433 Hydrocele, unspecified: Secondary | ICD-10-CM | POA: Diagnosis not present

## 2011-01-17 DIAGNOSIS — M5137 Other intervertebral disc degeneration, lumbosacral region: Secondary | ICD-10-CM | POA: Diagnosis not present

## 2011-01-18 DIAGNOSIS — IMO0002 Reserved for concepts with insufficient information to code with codable children: Secondary | ICD-10-CM | POA: Diagnosis not present

## 2011-01-23 DIAGNOSIS — E039 Hypothyroidism, unspecified: Secondary | ICD-10-CM | POA: Diagnosis not present

## 2011-01-23 DIAGNOSIS — I10 Essential (primary) hypertension: Secondary | ICD-10-CM | POA: Diagnosis not present

## 2011-01-23 DIAGNOSIS — Z79899 Other long term (current) drug therapy: Secondary | ICD-10-CM | POA: Diagnosis not present

## 2011-01-23 DIAGNOSIS — Z6832 Body mass index (BMI) 32.0-32.9, adult: Secondary | ICD-10-CM | POA: Diagnosis not present

## 2011-01-23 DIAGNOSIS — E785 Hyperlipidemia, unspecified: Secondary | ICD-10-CM | POA: Diagnosis not present

## 2011-01-28 DIAGNOSIS — M5137 Other intervertebral disc degeneration, lumbosacral region: Secondary | ICD-10-CM | POA: Diagnosis not present

## 2011-01-28 DIAGNOSIS — IMO0002 Reserved for concepts with insufficient information to code with codable children: Secondary | ICD-10-CM | POA: Diagnosis not present

## 2011-02-01 DIAGNOSIS — I509 Heart failure, unspecified: Secondary | ICD-10-CM | POA: Diagnosis not present

## 2011-02-01 DIAGNOSIS — E785 Hyperlipidemia, unspecified: Secondary | ICD-10-CM | POA: Diagnosis not present

## 2011-02-01 DIAGNOSIS — Z79899 Other long term (current) drug therapy: Secondary | ICD-10-CM | POA: Diagnosis not present

## 2011-02-01 DIAGNOSIS — Z6831 Body mass index (BMI) 31.0-31.9, adult: Secondary | ICD-10-CM | POA: Diagnosis not present

## 2011-02-06 DIAGNOSIS — F411 Generalized anxiety disorder: Secondary | ICD-10-CM | POA: Diagnosis not present

## 2011-02-06 DIAGNOSIS — Z6831 Body mass index (BMI) 31.0-31.9, adult: Secondary | ICD-10-CM | POA: Diagnosis not present

## 2011-02-06 DIAGNOSIS — I10 Essential (primary) hypertension: Secondary | ICD-10-CM | POA: Diagnosis not present

## 2011-02-11 DIAGNOSIS — IMO0002 Reserved for concepts with insufficient information to code with codable children: Secondary | ICD-10-CM | POA: Diagnosis not present

## 2011-02-14 DIAGNOSIS — F70 Mild intellectual disabilities: Secondary | ICD-10-CM | POA: Diagnosis not present

## 2011-03-04 DIAGNOSIS — IMO0002 Reserved for concepts with insufficient information to code with codable children: Secondary | ICD-10-CM | POA: Diagnosis not present

## 2011-03-14 DIAGNOSIS — M79609 Pain in unspecified limb: Secondary | ICD-10-CM | POA: Diagnosis not present

## 2011-03-14 DIAGNOSIS — F329 Major depressive disorder, single episode, unspecified: Secondary | ICD-10-CM | POA: Diagnosis not present

## 2011-03-14 DIAGNOSIS — F4310 Post-traumatic stress disorder, unspecified: Secondary | ICD-10-CM | POA: Diagnosis not present

## 2011-03-15 DIAGNOSIS — N401 Enlarged prostate with lower urinary tract symptoms: Secondary | ICD-10-CM | POA: Diagnosis not present

## 2011-03-15 DIAGNOSIS — N390 Urinary tract infection, site not specified: Secondary | ICD-10-CM | POA: Diagnosis not present

## 2011-03-24 DIAGNOSIS — I621 Nontraumatic extradural hemorrhage: Secondary | ICD-10-CM | POA: Diagnosis not present

## 2011-03-24 DIAGNOSIS — R079 Chest pain, unspecified: Secondary | ICD-10-CM | POA: Diagnosis not present

## 2011-03-25 DIAGNOSIS — IMO0002 Reserved for concepts with insufficient information to code with codable children: Secondary | ICD-10-CM | POA: Diagnosis not present

## 2011-03-25 DIAGNOSIS — S2249XA Multiple fractures of ribs, unspecified side, initial encounter for closed fracture: Secondary | ICD-10-CM | POA: Diagnosis not present

## 2011-03-25 DIAGNOSIS — R079 Chest pain, unspecified: Secondary | ICD-10-CM | POA: Diagnosis not present

## 2011-03-25 DIAGNOSIS — S20219A Contusion of unspecified front wall of thorax, initial encounter: Secondary | ICD-10-CM | POA: Diagnosis not present

## 2011-03-25 DIAGNOSIS — I2510 Atherosclerotic heart disease of native coronary artery without angina pectoris: Secondary | ICD-10-CM | POA: Diagnosis not present

## 2011-03-26 DIAGNOSIS — Z79899 Other long term (current) drug therapy: Secondary | ICD-10-CM | POA: Diagnosis not present

## 2011-03-26 DIAGNOSIS — N400 Enlarged prostate without lower urinary tract symptoms: Secondary | ICD-10-CM | POA: Diagnosis not present

## 2011-03-26 DIAGNOSIS — R339 Retention of urine, unspecified: Secondary | ICD-10-CM | POA: Diagnosis not present

## 2011-03-26 DIAGNOSIS — Z5181 Encounter for therapeutic drug level monitoring: Secondary | ICD-10-CM | POA: Diagnosis not present

## 2011-03-27 DIAGNOSIS — I2510 Atherosclerotic heart disease of native coronary artery without angina pectoris: Secondary | ICD-10-CM | POA: Diagnosis not present

## 2011-03-27 DIAGNOSIS — E1049 Type 1 diabetes mellitus with other diabetic neurological complication: Secondary | ICD-10-CM | POA: Diagnosis not present

## 2011-03-27 DIAGNOSIS — I959 Hypotension, unspecified: Secondary | ICD-10-CM | POA: Diagnosis not present

## 2011-03-27 DIAGNOSIS — I10 Essential (primary) hypertension: Secondary | ICD-10-CM | POA: Diagnosis not present

## 2011-03-27 DIAGNOSIS — I5030 Unspecified diastolic (congestive) heart failure: Secondary | ICD-10-CM | POA: Diagnosis not present

## 2011-03-27 DIAGNOSIS — R079 Chest pain, unspecified: Secondary | ICD-10-CM | POA: Diagnosis not present

## 2011-03-29 DIAGNOSIS — I10 Essential (primary) hypertension: Secondary | ICD-10-CM | POA: Diagnosis not present

## 2011-03-29 DIAGNOSIS — I951 Orthostatic hypotension: Secondary | ICD-10-CM | POA: Diagnosis not present

## 2011-03-29 DIAGNOSIS — Z6828 Body mass index (BMI) 28.0-28.9, adult: Secondary | ICD-10-CM | POA: Diagnosis not present

## 2011-03-29 DIAGNOSIS — R42 Dizziness and giddiness: Secondary | ICD-10-CM | POA: Diagnosis not present

## 2011-03-29 DIAGNOSIS — F329 Major depressive disorder, single episode, unspecified: Secondary | ICD-10-CM | POA: Diagnosis not present

## 2011-04-08 DIAGNOSIS — S20219A Contusion of unspecified front wall of thorax, initial encounter: Secondary | ICD-10-CM | POA: Diagnosis not present

## 2011-04-08 DIAGNOSIS — M7981 Nontraumatic hematoma of soft tissue: Secondary | ICD-10-CM | POA: Diagnosis not present

## 2011-04-15 DIAGNOSIS — I252 Old myocardial infarction: Secondary | ICD-10-CM | POA: Diagnosis not present

## 2011-04-15 DIAGNOSIS — R002 Palpitations: Secondary | ICD-10-CM | POA: Diagnosis not present

## 2011-04-15 DIAGNOSIS — E119 Type 2 diabetes mellitus without complications: Secondary | ICD-10-CM | POA: Diagnosis not present

## 2011-04-15 DIAGNOSIS — R6889 Other general symptoms and signs: Secondary | ICD-10-CM | POA: Diagnosis not present

## 2011-04-15 DIAGNOSIS — I10 Essential (primary) hypertension: Secondary | ICD-10-CM | POA: Diagnosis not present

## 2011-04-16 DIAGNOSIS — I9589 Other hypotension: Secondary | ICD-10-CM | POA: Diagnosis not present

## 2011-04-16 DIAGNOSIS — E119 Type 2 diabetes mellitus without complications: Secondary | ICD-10-CM | POA: Diagnosis not present

## 2011-04-16 DIAGNOSIS — E1149 Type 2 diabetes mellitus with other diabetic neurological complication: Secondary | ICD-10-CM | POA: Diagnosis not present

## 2011-04-16 DIAGNOSIS — I359 Nonrheumatic aortic valve disorder, unspecified: Secondary | ICD-10-CM | POA: Diagnosis not present

## 2011-04-16 DIAGNOSIS — N400 Enlarged prostate without lower urinary tract symptoms: Secondary | ICD-10-CM | POA: Diagnosis not present

## 2011-04-16 DIAGNOSIS — G4733 Obstructive sleep apnea (adult) (pediatric): Secondary | ICD-10-CM | POA: Diagnosis not present

## 2011-04-16 DIAGNOSIS — I5030 Unspecified diastolic (congestive) heart failure: Secondary | ICD-10-CM | POA: Diagnosis not present

## 2011-04-16 DIAGNOSIS — Z9861 Coronary angioplasty status: Secondary | ICD-10-CM | POA: Diagnosis not present

## 2011-04-16 DIAGNOSIS — E1142 Type 2 diabetes mellitus with diabetic polyneuropathy: Secondary | ICD-10-CM | POA: Diagnosis not present

## 2011-04-16 DIAGNOSIS — I517 Cardiomegaly: Secondary | ICD-10-CM | POA: Diagnosis not present

## 2011-04-16 DIAGNOSIS — Z7982 Long term (current) use of aspirin: Secondary | ICD-10-CM | POA: Diagnosis not present

## 2011-04-16 DIAGNOSIS — Z7902 Long term (current) use of antithrombotics/antiplatelets: Secondary | ICD-10-CM | POA: Diagnosis not present

## 2011-04-16 DIAGNOSIS — R002 Palpitations: Secondary | ICD-10-CM | POA: Diagnosis not present

## 2011-04-16 DIAGNOSIS — I951 Orthostatic hypotension: Secondary | ICD-10-CM | POA: Diagnosis not present

## 2011-04-16 DIAGNOSIS — I2510 Atherosclerotic heart disease of native coronary artery without angina pectoris: Secondary | ICD-10-CM | POA: Diagnosis not present

## 2011-04-16 DIAGNOSIS — R0609 Other forms of dyspnea: Secondary | ICD-10-CM | POA: Diagnosis not present

## 2011-04-16 DIAGNOSIS — E785 Hyperlipidemia, unspecified: Secondary | ICD-10-CM | POA: Diagnosis not present

## 2011-04-16 DIAGNOSIS — R0989 Other specified symptoms and signs involving the circulatory and respiratory systems: Secondary | ICD-10-CM | POA: Diagnosis not present

## 2011-04-16 DIAGNOSIS — Z794 Long term (current) use of insulin: Secondary | ICD-10-CM | POA: Diagnosis not present

## 2011-04-16 DIAGNOSIS — Z79899 Other long term (current) drug therapy: Secondary | ICD-10-CM | POA: Diagnosis not present

## 2011-04-16 DIAGNOSIS — R000 Tachycardia, unspecified: Secondary | ICD-10-CM | POA: Diagnosis not present

## 2011-04-16 DIAGNOSIS — R079 Chest pain, unspecified: Secondary | ICD-10-CM | POA: Diagnosis not present

## 2011-04-16 DIAGNOSIS — G319 Degenerative disease of nervous system, unspecified: Secondary | ICD-10-CM | POA: Diagnosis not present

## 2011-04-16 DIAGNOSIS — I252 Old myocardial infarction: Secondary | ICD-10-CM | POA: Diagnosis not present

## 2011-04-16 DIAGNOSIS — R0789 Other chest pain: Secondary | ICD-10-CM | POA: Diagnosis not present

## 2011-04-16 DIAGNOSIS — R42 Dizziness and giddiness: Secondary | ICD-10-CM | POA: Diagnosis not present

## 2011-04-16 DIAGNOSIS — I509 Heart failure, unspecified: Secondary | ICD-10-CM | POA: Diagnosis not present

## 2011-04-16 DIAGNOSIS — I369 Nonrheumatic tricuspid valve disorder, unspecified: Secondary | ICD-10-CM | POA: Diagnosis not present

## 2011-04-16 DIAGNOSIS — Z96659 Presence of unspecified artificial knee joint: Secondary | ICD-10-CM | POA: Diagnosis not present

## 2011-04-16 DIAGNOSIS — I10 Essential (primary) hypertension: Secondary | ICD-10-CM | POA: Diagnosis not present

## 2011-04-16 DIAGNOSIS — Z8249 Family history of ischemic heart disease and other diseases of the circulatory system: Secondary | ICD-10-CM | POA: Diagnosis not present

## 2011-04-16 DIAGNOSIS — Z9889 Other specified postprocedural states: Secondary | ICD-10-CM | POA: Diagnosis not present

## 2011-04-17 DIAGNOSIS — N400 Enlarged prostate without lower urinary tract symptoms: Secondary | ICD-10-CM | POA: Diagnosis not present

## 2011-04-17 DIAGNOSIS — G319 Degenerative disease of nervous system, unspecified: Secondary | ICD-10-CM | POA: Diagnosis not present

## 2011-04-17 DIAGNOSIS — I951 Orthostatic hypotension: Secondary | ICD-10-CM | POA: Diagnosis not present

## 2011-04-17 DIAGNOSIS — E1149 Type 2 diabetes mellitus with other diabetic neurological complication: Secondary | ICD-10-CM | POA: Diagnosis not present

## 2011-04-17 DIAGNOSIS — R0789 Other chest pain: Secondary | ICD-10-CM | POA: Diagnosis not present

## 2011-04-17 DIAGNOSIS — Z79899 Other long term (current) drug therapy: Secondary | ICD-10-CM | POA: Diagnosis not present

## 2011-04-17 DIAGNOSIS — R42 Dizziness and giddiness: Secondary | ICD-10-CM | POA: Diagnosis not present

## 2011-04-17 DIAGNOSIS — I5030 Unspecified diastolic (congestive) heart failure: Secondary | ICD-10-CM | POA: Diagnosis not present

## 2011-04-17 DIAGNOSIS — I2510 Atherosclerotic heart disease of native coronary artery without angina pectoris: Secondary | ICD-10-CM | POA: Diagnosis not present

## 2011-04-17 DIAGNOSIS — I10 Essential (primary) hypertension: Secondary | ICD-10-CM | POA: Diagnosis not present

## 2011-04-18 DIAGNOSIS — I2510 Atherosclerotic heart disease of native coronary artery without angina pectoris: Secondary | ICD-10-CM | POA: Diagnosis not present

## 2011-04-18 DIAGNOSIS — I951 Orthostatic hypotension: Secondary | ICD-10-CM | POA: Diagnosis not present

## 2011-04-18 DIAGNOSIS — R0789 Other chest pain: Secondary | ICD-10-CM | POA: Diagnosis not present

## 2011-04-18 DIAGNOSIS — R42 Dizziness and giddiness: Secondary | ICD-10-CM | POA: Diagnosis not present

## 2011-04-18 DIAGNOSIS — N400 Enlarged prostate without lower urinary tract symptoms: Secondary | ICD-10-CM | POA: Diagnosis not present

## 2011-04-24 DIAGNOSIS — I951 Orthostatic hypotension: Secondary | ICD-10-CM | POA: Diagnosis not present

## 2011-04-24 DIAGNOSIS — R0789 Other chest pain: Secondary | ICD-10-CM | POA: Diagnosis not present

## 2011-04-24 DIAGNOSIS — Z6828 Body mass index (BMI) 28.0-28.9, adult: Secondary | ICD-10-CM | POA: Diagnosis not present

## 2011-04-24 DIAGNOSIS — I2510 Atherosclerotic heart disease of native coronary artery without angina pectoris: Secondary | ICD-10-CM | POA: Diagnosis not present

## 2011-05-09 DIAGNOSIS — N529 Male erectile dysfunction, unspecified: Secondary | ICD-10-CM | POA: Diagnosis not present

## 2011-05-09 DIAGNOSIS — Z125 Encounter for screening for malignant neoplasm of prostate: Secondary | ICD-10-CM | POA: Diagnosis not present

## 2011-05-09 DIAGNOSIS — N401 Enlarged prostate with lower urinary tract symptoms: Secondary | ICD-10-CM | POA: Diagnosis not present

## 2011-05-14 DIAGNOSIS — I10 Essential (primary) hypertension: Secondary | ICD-10-CM | POA: Diagnosis not present

## 2011-05-14 DIAGNOSIS — E785 Hyperlipidemia, unspecified: Secondary | ICD-10-CM | POA: Diagnosis not present

## 2011-05-14 DIAGNOSIS — Z6829 Body mass index (BMI) 29.0-29.9, adult: Secondary | ICD-10-CM | POA: Diagnosis not present

## 2011-06-05 DIAGNOSIS — L508 Other urticaria: Secondary | ICD-10-CM | POA: Diagnosis not present

## 2011-06-12 DIAGNOSIS — E7800 Pure hypercholesterolemia, unspecified: Secondary | ICD-10-CM | POA: Diagnosis not present

## 2011-06-12 DIAGNOSIS — I10 Essential (primary) hypertension: Secondary | ICD-10-CM | POA: Diagnosis not present

## 2011-06-12 DIAGNOSIS — K219 Gastro-esophageal reflux disease without esophagitis: Secondary | ICD-10-CM | POA: Diagnosis not present

## 2011-06-12 DIAGNOSIS — I509 Heart failure, unspecified: Secondary | ICD-10-CM | POA: Diagnosis not present

## 2011-06-12 DIAGNOSIS — I252 Old myocardial infarction: Secondary | ICD-10-CM | POA: Diagnosis not present

## 2011-06-12 DIAGNOSIS — E119 Type 2 diabetes mellitus without complications: Secondary | ICD-10-CM | POA: Diagnosis not present

## 2011-06-12 DIAGNOSIS — L539 Erythematous condition, unspecified: Secondary | ICD-10-CM | POA: Diagnosis not present

## 2011-06-12 DIAGNOSIS — M79609 Pain in unspecified limb: Secondary | ICD-10-CM | POA: Diagnosis not present

## 2011-07-16 DIAGNOSIS — B351 Tinea unguium: Secondary | ICD-10-CM | POA: Diagnosis not present

## 2011-07-16 DIAGNOSIS — E1142 Type 2 diabetes mellitus with diabetic polyneuropathy: Secondary | ICD-10-CM | POA: Diagnosis not present

## 2011-07-16 DIAGNOSIS — E1049 Type 1 diabetes mellitus with other diabetic neurological complication: Secondary | ICD-10-CM | POA: Diagnosis not present

## 2011-07-17 DIAGNOSIS — Z6831 Body mass index (BMI) 31.0-31.9, adult: Secondary | ICD-10-CM | POA: Diagnosis not present

## 2011-07-17 DIAGNOSIS — R413 Other amnesia: Secondary | ICD-10-CM | POA: Diagnosis not present

## 2011-07-17 DIAGNOSIS — I10 Essential (primary) hypertension: Secondary | ICD-10-CM | POA: Diagnosis not present

## 2011-07-18 DIAGNOSIS — G309 Alzheimer's disease, unspecified: Secondary | ICD-10-CM | POA: Diagnosis not present

## 2011-07-18 DIAGNOSIS — F0280 Dementia in other diseases classified elsewhere without behavioral disturbance: Secondary | ICD-10-CM | POA: Diagnosis not present

## 2011-07-19 DIAGNOSIS — N529 Male erectile dysfunction, unspecified: Secondary | ICD-10-CM | POA: Diagnosis not present

## 2011-07-19 DIAGNOSIS — N401 Enlarged prostate with lower urinary tract symptoms: Secondary | ICD-10-CM | POA: Diagnosis not present

## 2011-08-09 DIAGNOSIS — N400 Enlarged prostate without lower urinary tract symptoms: Secondary | ICD-10-CM | POA: Diagnosis not present

## 2011-08-09 DIAGNOSIS — N390 Urinary tract infection, site not specified: Secondary | ICD-10-CM | POA: Diagnosis not present

## 2011-08-20 DIAGNOSIS — E039 Hypothyroidism, unspecified: Secondary | ICD-10-CM | POA: Diagnosis not present

## 2011-08-20 DIAGNOSIS — I5030 Unspecified diastolic (congestive) heart failure: Secondary | ICD-10-CM | POA: Diagnosis not present

## 2011-08-20 DIAGNOSIS — I951 Orthostatic hypotension: Secondary | ICD-10-CM | POA: Diagnosis not present

## 2011-08-20 DIAGNOSIS — E119 Type 2 diabetes mellitus without complications: Secondary | ICD-10-CM | POA: Diagnosis not present

## 2011-08-20 DIAGNOSIS — E785 Hyperlipidemia, unspecified: Secondary | ICD-10-CM | POA: Diagnosis not present

## 2011-08-20 DIAGNOSIS — I10 Essential (primary) hypertension: Secondary | ICD-10-CM | POA: Diagnosis not present

## 2011-08-20 DIAGNOSIS — Z6831 Body mass index (BMI) 31.0-31.9, adult: Secondary | ICD-10-CM | POA: Diagnosis not present

## 2011-08-20 DIAGNOSIS — I2510 Atherosclerotic heart disease of native coronary artery without angina pectoris: Secondary | ICD-10-CM | POA: Diagnosis not present

## 2011-09-16 DIAGNOSIS — E785 Hyperlipidemia, unspecified: Secondary | ICD-10-CM | POA: Diagnosis not present

## 2011-09-16 DIAGNOSIS — E039 Hypothyroidism, unspecified: Secondary | ICD-10-CM | POA: Diagnosis not present

## 2011-09-16 DIAGNOSIS — I2510 Atherosclerotic heart disease of native coronary artery without angina pectoris: Secondary | ICD-10-CM | POA: Diagnosis not present

## 2011-09-16 DIAGNOSIS — E119 Type 2 diabetes mellitus without complications: Secondary | ICD-10-CM | POA: Diagnosis not present

## 2011-09-20 DIAGNOSIS — IMO0002 Reserved for concepts with insufficient information to code with codable children: Secondary | ICD-10-CM | POA: Diagnosis not present

## 2011-10-03 DIAGNOSIS — J019 Acute sinusitis, unspecified: Secondary | ICD-10-CM | POA: Diagnosis not present

## 2011-10-03 DIAGNOSIS — J209 Acute bronchitis, unspecified: Secondary | ICD-10-CM | POA: Diagnosis not present

## 2011-10-30 DIAGNOSIS — Z6832 Body mass index (BMI) 32.0-32.9, adult: Secondary | ICD-10-CM | POA: Diagnosis not present

## 2011-10-30 DIAGNOSIS — M6740 Ganglion, unspecified site: Secondary | ICD-10-CM | POA: Diagnosis not present

## 2011-11-08 DIAGNOSIS — N529 Male erectile dysfunction, unspecified: Secondary | ICD-10-CM | POA: Diagnosis not present

## 2011-11-11 DIAGNOSIS — N529 Male erectile dysfunction, unspecified: Secondary | ICD-10-CM | POA: Diagnosis not present

## 2011-11-11 DIAGNOSIS — N401 Enlarged prostate with lower urinary tract symptoms: Secondary | ICD-10-CM | POA: Diagnosis not present

## 2011-11-28 DIAGNOSIS — I2510 Atherosclerotic heart disease of native coronary artery without angina pectoris: Secondary | ICD-10-CM | POA: Diagnosis not present

## 2011-11-28 DIAGNOSIS — M47814 Spondylosis without myelopathy or radiculopathy, thoracic region: Secondary | ICD-10-CM | POA: Diagnosis not present

## 2011-11-28 DIAGNOSIS — E119 Type 2 diabetes mellitus without complications: Secondary | ICD-10-CM | POA: Diagnosis not present

## 2011-11-28 DIAGNOSIS — E785 Hyperlipidemia, unspecified: Secondary | ICD-10-CM | POA: Diagnosis not present

## 2011-11-28 DIAGNOSIS — I10 Essential (primary) hypertension: Secondary | ICD-10-CM | POA: Diagnosis not present

## 2011-11-28 DIAGNOSIS — I5030 Unspecified diastolic (congestive) heart failure: Secondary | ICD-10-CM | POA: Diagnosis not present

## 2011-11-28 DIAGNOSIS — R079 Chest pain, unspecified: Secondary | ICD-10-CM | POA: Diagnosis not present

## 2011-11-28 DIAGNOSIS — E039 Hypothyroidism, unspecified: Secondary | ICD-10-CM | POA: Diagnosis not present

## 2012-01-13 DIAGNOSIS — R0989 Other specified symptoms and signs involving the circulatory and respiratory systems: Secondary | ICD-10-CM | POA: Diagnosis not present

## 2012-01-13 DIAGNOSIS — R52 Pain, unspecified: Secondary | ICD-10-CM | POA: Diagnosis not present

## 2012-01-13 DIAGNOSIS — I2510 Atherosclerotic heart disease of native coronary artery without angina pectoris: Secondary | ICD-10-CM | POA: Diagnosis not present

## 2012-01-13 DIAGNOSIS — I10 Essential (primary) hypertension: Secondary | ICD-10-CM | POA: Diagnosis not present

## 2012-01-13 DIAGNOSIS — Z79899 Other long term (current) drug therapy: Secondary | ICD-10-CM | POA: Diagnosis not present

## 2012-01-13 DIAGNOSIS — R079 Chest pain, unspecified: Secondary | ICD-10-CM | POA: Diagnosis not present

## 2012-01-13 DIAGNOSIS — R0602 Shortness of breath: Secondary | ICD-10-CM | POA: Diagnosis not present

## 2012-01-13 DIAGNOSIS — E119 Type 2 diabetes mellitus without complications: Secondary | ICD-10-CM | POA: Diagnosis not present

## 2012-01-13 DIAGNOSIS — R0609 Other forms of dyspnea: Secondary | ICD-10-CM | POA: Diagnosis not present

## 2012-01-16 DIAGNOSIS — N289 Disorder of kidney and ureter, unspecified: Secondary | ICD-10-CM | POA: Diagnosis not present

## 2012-01-16 DIAGNOSIS — Z6832 Body mass index (BMI) 32.0-32.9, adult: Secondary | ICD-10-CM | POA: Diagnosis not present

## 2012-01-16 DIAGNOSIS — E039 Hypothyroidism, unspecified: Secondary | ICD-10-CM | POA: Diagnosis not present

## 2012-01-16 DIAGNOSIS — G479 Sleep disorder, unspecified: Secondary | ICD-10-CM | POA: Diagnosis not present

## 2012-01-16 DIAGNOSIS — F411 Generalized anxiety disorder: Secondary | ICD-10-CM | POA: Diagnosis not present

## 2012-01-20 DIAGNOSIS — N289 Disorder of kidney and ureter, unspecified: Secondary | ICD-10-CM | POA: Diagnosis not present

## 2012-01-20 DIAGNOSIS — K7689 Other specified diseases of liver: Secondary | ICD-10-CM | POA: Diagnosis not present

## 2012-02-02 DIAGNOSIS — E7800 Pure hypercholesterolemia, unspecified: Secondary | ICD-10-CM | POA: Diagnosis not present

## 2012-02-02 DIAGNOSIS — K5732 Diverticulitis of large intestine without perforation or abscess without bleeding: Secondary | ICD-10-CM | POA: Diagnosis not present

## 2012-02-02 DIAGNOSIS — I10 Essential (primary) hypertension: Secondary | ICD-10-CM | POA: Diagnosis not present

## 2012-02-02 DIAGNOSIS — I509 Heart failure, unspecified: Secondary | ICD-10-CM | POA: Diagnosis not present

## 2012-02-02 DIAGNOSIS — K2970 Gastritis, unspecified, without bleeding: Secondary | ICD-10-CM | POA: Diagnosis not present

## 2012-02-02 DIAGNOSIS — R109 Unspecified abdominal pain: Secondary | ICD-10-CM | POA: Diagnosis not present

## 2012-02-02 DIAGNOSIS — K2990 Gastroduodenitis, unspecified, without bleeding: Secondary | ICD-10-CM | POA: Diagnosis not present

## 2012-02-02 DIAGNOSIS — K219 Gastro-esophageal reflux disease without esophagitis: Secondary | ICD-10-CM | POA: Diagnosis not present

## 2012-02-02 DIAGNOSIS — R10819 Abdominal tenderness, unspecified site: Secondary | ICD-10-CM | POA: Diagnosis not present

## 2012-02-02 DIAGNOSIS — E119 Type 2 diabetes mellitus without complications: Secondary | ICD-10-CM | POA: Diagnosis not present

## 2012-02-03 DIAGNOSIS — Z6832 Body mass index (BMI) 32.0-32.9, adult: Secondary | ICD-10-CM | POA: Diagnosis not present

## 2012-02-03 DIAGNOSIS — K5732 Diverticulitis of large intestine without perforation or abscess without bleeding: Secondary | ICD-10-CM | POA: Diagnosis not present

## 2012-02-03 DIAGNOSIS — R339 Retention of urine, unspecified: Secondary | ICD-10-CM | POA: Diagnosis not present

## 2012-02-25 DIAGNOSIS — S8000XA Contusion of unspecified knee, initial encounter: Secondary | ICD-10-CM | POA: Diagnosis not present

## 2012-02-25 DIAGNOSIS — E119 Type 2 diabetes mellitus without complications: Secondary | ICD-10-CM | POA: Diagnosis not present

## 2012-02-25 DIAGNOSIS — S0990XA Unspecified injury of head, initial encounter: Secondary | ICD-10-CM | POA: Diagnosis not present

## 2012-02-25 DIAGNOSIS — W108XXA Fall (on) (from) other stairs and steps, initial encounter: Secondary | ICD-10-CM | POA: Diagnosis not present

## 2012-02-25 DIAGNOSIS — IMO0002 Reserved for concepts with insufficient information to code with codable children: Secondary | ICD-10-CM | POA: Diagnosis not present

## 2012-02-25 DIAGNOSIS — E7800 Pure hypercholesterolemia, unspecified: Secondary | ICD-10-CM | POA: Diagnosis not present

## 2012-02-25 DIAGNOSIS — K219 Gastro-esophageal reflux disease without esophagitis: Secondary | ICD-10-CM | POA: Diagnosis not present

## 2012-02-25 DIAGNOSIS — I509 Heart failure, unspecified: Secondary | ICD-10-CM | POA: Diagnosis not present

## 2012-02-25 DIAGNOSIS — I10 Essential (primary) hypertension: Secondary | ICD-10-CM | POA: Diagnosis not present

## 2012-02-25 DIAGNOSIS — S8990XA Unspecified injury of unspecified lower leg, initial encounter: Secondary | ICD-10-CM | POA: Diagnosis not present

## 2012-04-27 DIAGNOSIS — Z6833 Body mass index (BMI) 33.0-33.9, adult: Secondary | ICD-10-CM | POA: Diagnosis not present

## 2012-04-27 DIAGNOSIS — I10 Essential (primary) hypertension: Secondary | ICD-10-CM | POA: Diagnosis not present

## 2012-04-27 DIAGNOSIS — R42 Dizziness and giddiness: Secondary | ICD-10-CM | POA: Diagnosis not present

## 2012-04-28 DIAGNOSIS — J310 Chronic rhinitis: Secondary | ICD-10-CM | POA: Diagnosis not present

## 2012-04-28 DIAGNOSIS — G4730 Sleep apnea, unspecified: Secondary | ICD-10-CM | POA: Diagnosis not present

## 2012-04-28 DIAGNOSIS — G4700 Insomnia, unspecified: Secondary | ICD-10-CM | POA: Diagnosis not present

## 2012-04-28 DIAGNOSIS — R5383 Other fatigue: Secondary | ICD-10-CM | POA: Diagnosis not present

## 2012-04-28 DIAGNOSIS — R0902 Hypoxemia: Secondary | ICD-10-CM | POA: Diagnosis not present

## 2012-05-05 DIAGNOSIS — G4730 Sleep apnea, unspecified: Secondary | ICD-10-CM | POA: Diagnosis not present

## 2012-05-14 DIAGNOSIS — R5381 Other malaise: Secondary | ICD-10-CM | POA: Diagnosis not present

## 2012-05-14 DIAGNOSIS — J310 Chronic rhinitis: Secondary | ICD-10-CM | POA: Diagnosis not present

## 2012-05-14 DIAGNOSIS — R0902 Hypoxemia: Secondary | ICD-10-CM | POA: Diagnosis not present

## 2012-05-14 DIAGNOSIS — G4710 Hypersomnia, unspecified: Secondary | ICD-10-CM | POA: Diagnosis not present

## 2012-05-14 DIAGNOSIS — R5383 Other fatigue: Secondary | ICD-10-CM | POA: Diagnosis not present

## 2012-05-19 DIAGNOSIS — R5383 Other fatigue: Secondary | ICD-10-CM | POA: Diagnosis not present

## 2012-05-19 DIAGNOSIS — Z006 Encounter for examination for normal comparison and control in clinical research program: Secondary | ICD-10-CM | POA: Diagnosis not present

## 2012-05-19 DIAGNOSIS — Z7902 Long term (current) use of antithrombotics/antiplatelets: Secondary | ICD-10-CM | POA: Diagnosis not present

## 2012-05-19 DIAGNOSIS — I2510 Atherosclerotic heart disease of native coronary artery without angina pectoris: Secondary | ICD-10-CM | POA: Diagnosis not present

## 2012-06-05 DIAGNOSIS — Z1331 Encounter for screening for depression: Secondary | ICD-10-CM | POA: Diagnosis not present

## 2012-06-05 DIAGNOSIS — E785 Hyperlipidemia, unspecified: Secondary | ICD-10-CM | POA: Diagnosis not present

## 2012-06-05 DIAGNOSIS — I10 Essential (primary) hypertension: Secondary | ICD-10-CM | POA: Diagnosis not present

## 2012-06-05 DIAGNOSIS — E039 Hypothyroidism, unspecified: Secondary | ICD-10-CM | POA: Diagnosis not present

## 2012-06-05 DIAGNOSIS — Z6833 Body mass index (BMI) 33.0-33.9, adult: Secondary | ICD-10-CM | POA: Diagnosis not present

## 2012-06-05 DIAGNOSIS — Z9181 History of falling: Secondary | ICD-10-CM | POA: Diagnosis not present

## 2012-06-05 DIAGNOSIS — E119 Type 2 diabetes mellitus without complications: Secondary | ICD-10-CM | POA: Diagnosis not present

## 2012-06-05 DIAGNOSIS — Z79899 Other long term (current) drug therapy: Secondary | ICD-10-CM | POA: Diagnosis not present

## 2012-06-29 DIAGNOSIS — J310 Chronic rhinitis: Secondary | ICD-10-CM | POA: Diagnosis not present

## 2012-06-29 DIAGNOSIS — R0902 Hypoxemia: Secondary | ICD-10-CM | POA: Diagnosis not present

## 2012-06-29 DIAGNOSIS — G4700 Insomnia, unspecified: Secondary | ICD-10-CM | POA: Diagnosis not present

## 2012-06-29 DIAGNOSIS — G4730 Sleep apnea, unspecified: Secondary | ICD-10-CM | POA: Diagnosis not present

## 2012-06-30 DIAGNOSIS — G4700 Insomnia, unspecified: Secondary | ICD-10-CM | POA: Diagnosis not present

## 2012-08-27 DIAGNOSIS — Z6834 Body mass index (BMI) 34.0-34.9, adult: Secondary | ICD-10-CM | POA: Diagnosis not present

## 2012-08-27 DIAGNOSIS — R079 Chest pain, unspecified: Secondary | ICD-10-CM | POA: Diagnosis not present

## 2012-09-09 DIAGNOSIS — Z6834 Body mass index (BMI) 34.0-34.9, adult: Secondary | ICD-10-CM | POA: Diagnosis not present

## 2012-09-09 DIAGNOSIS — I2510 Atherosclerotic heart disease of native coronary artery without angina pectoris: Secondary | ICD-10-CM | POA: Diagnosis not present

## 2012-09-09 DIAGNOSIS — Z79899 Other long term (current) drug therapy: Secondary | ICD-10-CM | POA: Diagnosis not present

## 2012-09-09 DIAGNOSIS — I10 Essential (primary) hypertension: Secondary | ICD-10-CM | POA: Diagnosis not present

## 2012-09-09 DIAGNOSIS — E785 Hyperlipidemia, unspecified: Secondary | ICD-10-CM | POA: Diagnosis not present

## 2012-09-09 DIAGNOSIS — E039 Hypothyroidism, unspecified: Secondary | ICD-10-CM | POA: Diagnosis not present

## 2012-09-09 DIAGNOSIS — E119 Type 2 diabetes mellitus without complications: Secondary | ICD-10-CM | POA: Diagnosis not present

## 2012-09-29 DIAGNOSIS — J310 Chronic rhinitis: Secondary | ICD-10-CM | POA: Diagnosis not present

## 2012-09-29 DIAGNOSIS — R0902 Hypoxemia: Secondary | ICD-10-CM | POA: Diagnosis not present

## 2012-09-29 DIAGNOSIS — G4700 Insomnia, unspecified: Secondary | ICD-10-CM | POA: Diagnosis not present

## 2012-09-30 DIAGNOSIS — G4700 Insomnia, unspecified: Secondary | ICD-10-CM | POA: Diagnosis not present

## 2012-10-05 DIAGNOSIS — J310 Chronic rhinitis: Secondary | ICD-10-CM | POA: Diagnosis not present

## 2012-10-08 DIAGNOSIS — E039 Hypothyroidism, unspecified: Secondary | ICD-10-CM | POA: Diagnosis not present

## 2012-10-14 DIAGNOSIS — M25569 Pain in unspecified knee: Secondary | ICD-10-CM | POA: Diagnosis not present

## 2012-10-15 DIAGNOSIS — N401 Enlarged prostate with lower urinary tract symptoms: Secondary | ICD-10-CM | POA: Diagnosis not present

## 2012-10-15 DIAGNOSIS — R361 Hematospermia: Secondary | ICD-10-CM | POA: Diagnosis not present

## 2012-10-19 DIAGNOSIS — I10 Essential (primary) hypertension: Secondary | ICD-10-CM | POA: Diagnosis not present

## 2012-10-19 DIAGNOSIS — R6889 Other general symptoms and signs: Secondary | ICD-10-CM | POA: Diagnosis not present

## 2012-10-19 DIAGNOSIS — R110 Nausea: Secondary | ICD-10-CM | POA: Diagnosis not present

## 2012-10-19 DIAGNOSIS — K7689 Other specified diseases of liver: Secondary | ICD-10-CM | POA: Diagnosis not present

## 2012-10-19 DIAGNOSIS — R112 Nausea with vomiting, unspecified: Secondary | ICD-10-CM | POA: Diagnosis not present

## 2012-10-21 DIAGNOSIS — I10 Essential (primary) hypertension: Secondary | ICD-10-CM | POA: Diagnosis not present

## 2012-10-21 DIAGNOSIS — I252 Old myocardial infarction: Secondary | ICD-10-CM | POA: Diagnosis not present

## 2012-10-21 DIAGNOSIS — I2510 Atherosclerotic heart disease of native coronary artery without angina pectoris: Secondary | ICD-10-CM | POA: Diagnosis not present

## 2012-10-21 DIAGNOSIS — Z6833 Body mass index (BMI) 33.0-33.9, adult: Secondary | ICD-10-CM | POA: Diagnosis not present

## 2012-11-03 DIAGNOSIS — I5030 Unspecified diastolic (congestive) heart failure: Secondary | ICD-10-CM | POA: Diagnosis not present

## 2012-11-03 DIAGNOSIS — E785 Hyperlipidemia, unspecified: Secondary | ICD-10-CM | POA: Diagnosis not present

## 2012-11-03 DIAGNOSIS — I2510 Atherosclerotic heart disease of native coronary artery without angina pectoris: Secondary | ICD-10-CM | POA: Diagnosis not present

## 2012-11-03 DIAGNOSIS — I10 Essential (primary) hypertension: Secondary | ICD-10-CM | POA: Diagnosis not present

## 2012-11-04 ENCOUNTER — Inpatient Hospital Stay (HOSPITAL_COMMUNITY): Admission: RE | Admit: 2012-11-04 | Payer: Self-pay | Source: Ambulatory Visit

## 2012-11-09 ENCOUNTER — Inpatient Hospital Stay (HOSPITAL_COMMUNITY): Admission: RE | Admit: 2012-11-09 | Payer: Medicare Other | Source: Ambulatory Visit | Admitting: Orthopedic Surgery

## 2012-11-09 ENCOUNTER — Encounter (HOSPITAL_COMMUNITY): Admission: RE | Payer: Self-pay | Source: Ambulatory Visit

## 2012-11-09 SURGERY — ARTHROPLASTY, KNEE, TOTAL
Anesthesia: Spinal | Site: Knee | Laterality: Right

## 2012-11-11 ENCOUNTER — Ambulatory Visit: Payer: Self-pay | Admitting: Orthopedic Surgery

## 2012-11-11 DIAGNOSIS — Z01818 Encounter for other preprocedural examination: Secondary | ICD-10-CM | POA: Diagnosis not present

## 2012-11-11 DIAGNOSIS — M1710 Unilateral primary osteoarthritis, unspecified knee: Secondary | ICD-10-CM | POA: Diagnosis not present

## 2012-11-11 DIAGNOSIS — IMO0002 Reserved for concepts with insufficient information to code with codable children: Secondary | ICD-10-CM | POA: Diagnosis not present

## 2012-11-11 DIAGNOSIS — M25569 Pain in unspecified knee: Secondary | ICD-10-CM | POA: Diagnosis not present

## 2012-11-26 DIAGNOSIS — R361 Hematospermia: Secondary | ICD-10-CM | POA: Diagnosis not present

## 2012-11-26 DIAGNOSIS — Z125 Encounter for screening for malignant neoplasm of prostate: Secondary | ICD-10-CM | POA: Diagnosis not present

## 2012-11-26 DIAGNOSIS — N401 Enlarged prostate with lower urinary tract symptoms: Secondary | ICD-10-CM | POA: Diagnosis not present

## 2012-12-11 DIAGNOSIS — Z125 Encounter for screening for malignant neoplasm of prostate: Secondary | ICD-10-CM | POA: Diagnosis not present

## 2012-12-11 DIAGNOSIS — E785 Hyperlipidemia, unspecified: Secondary | ICD-10-CM | POA: Diagnosis not present

## 2012-12-11 DIAGNOSIS — E039 Hypothyroidism, unspecified: Secondary | ICD-10-CM | POA: Diagnosis not present

## 2012-12-11 DIAGNOSIS — Z79899 Other long term (current) drug therapy: Secondary | ICD-10-CM | POA: Diagnosis not present

## 2012-12-15 DIAGNOSIS — E785 Hyperlipidemia, unspecified: Secondary | ICD-10-CM | POA: Diagnosis not present

## 2012-12-15 DIAGNOSIS — I10 Essential (primary) hypertension: Secondary | ICD-10-CM | POA: Diagnosis not present

## 2012-12-15 DIAGNOSIS — Z6833 Body mass index (BMI) 33.0-33.9, adult: Secondary | ICD-10-CM | POA: Diagnosis not present

## 2013-01-04 ENCOUNTER — Ambulatory Visit: Payer: Self-pay | Admitting: Orthopedic Surgery

## 2013-01-04 DIAGNOSIS — I2510 Atherosclerotic heart disease of native coronary artery without angina pectoris: Secondary | ICD-10-CM | POA: Diagnosis not present

## 2013-01-04 DIAGNOSIS — Z01810 Encounter for preprocedural cardiovascular examination: Secondary | ICD-10-CM | POA: Diagnosis not present

## 2013-01-04 DIAGNOSIS — Z01812 Encounter for preprocedural laboratory examination: Secondary | ICD-10-CM | POA: Diagnosis not present

## 2013-01-04 DIAGNOSIS — M79609 Pain in unspecified limb: Secondary | ICD-10-CM | POA: Diagnosis not present

## 2013-01-04 DIAGNOSIS — IMO0002 Reserved for concepts with insufficient information to code with codable children: Secondary | ICD-10-CM | POA: Diagnosis not present

## 2013-01-04 LAB — BASIC METABOLIC PANEL
Anion Gap: 7 (ref 7–16)
Chloride: 105 mmol/L (ref 98–107)
Co2: 26 mmol/L (ref 21–32)
Creatinine: 1.12 mg/dL (ref 0.60–1.30)
Glucose: 107 mg/dL — ABNORMAL HIGH (ref 65–99)
Osmolality: 277 (ref 275–301)
Potassium: 3.9 mmol/L (ref 3.5–5.1)
Sodium: 138 mmol/L (ref 136–145)

## 2013-01-04 LAB — SEDIMENTATION RATE: Erythrocyte Sed Rate: 1 mm/hr (ref 0–20)

## 2013-01-04 LAB — CBC
HGB: 15.9 g/dL (ref 13.0–18.0)
MCH: 30.1 pg (ref 26.0–34.0)
MCHC: 33.2 g/dL (ref 32.0–36.0)
MCV: 91 fL (ref 80–100)
Platelet: 277 10*3/uL (ref 150–440)
RBC: 5.29 10*6/uL (ref 4.40–5.90)
RDW: 13.4 % (ref 11.5–14.5)

## 2013-01-04 LAB — APTT: Activated PTT: 29.7 secs (ref 23.6–35.9)

## 2013-01-20 DIAGNOSIS — Z6833 Body mass index (BMI) 33.0-33.9, adult: Secondary | ICD-10-CM | POA: Diagnosis not present

## 2013-01-20 DIAGNOSIS — J209 Acute bronchitis, unspecified: Secondary | ICD-10-CM | POA: Diagnosis not present

## 2013-02-05 DIAGNOSIS — N138 Other obstructive and reflux uropathy: Secondary | ICD-10-CM | POA: Diagnosis not present

## 2013-02-05 DIAGNOSIS — N489 Disorder of penis, unspecified: Secondary | ICD-10-CM | POA: Diagnosis not present

## 2013-02-05 DIAGNOSIS — N401 Enlarged prostate with lower urinary tract symptoms: Secondary | ICD-10-CM | POA: Diagnosis not present

## 2013-02-11 DIAGNOSIS — N489 Disorder of penis, unspecified: Secondary | ICD-10-CM | POA: Diagnosis not present

## 2013-02-11 DIAGNOSIS — N476 Balanoposthitis: Secondary | ICD-10-CM | POA: Diagnosis not present

## 2013-03-16 DIAGNOSIS — IMO0001 Reserved for inherently not codable concepts without codable children: Secondary | ICD-10-CM | POA: Diagnosis not present

## 2013-03-16 DIAGNOSIS — Z6832 Body mass index (BMI) 32.0-32.9, adult: Secondary | ICD-10-CM | POA: Diagnosis not present

## 2013-03-16 DIAGNOSIS — E785 Hyperlipidemia, unspecified: Secondary | ICD-10-CM | POA: Diagnosis not present

## 2013-03-16 DIAGNOSIS — I10 Essential (primary) hypertension: Secondary | ICD-10-CM | POA: Diagnosis not present

## 2013-03-17 DIAGNOSIS — Z79899 Other long term (current) drug therapy: Secondary | ICD-10-CM | POA: Diagnosis not present

## 2013-03-17 DIAGNOSIS — E785 Hyperlipidemia, unspecified: Secondary | ICD-10-CM | POA: Diagnosis not present

## 2013-03-17 DIAGNOSIS — E039 Hypothyroidism, unspecified: Secondary | ICD-10-CM | POA: Diagnosis not present

## 2013-03-17 DIAGNOSIS — I10 Essential (primary) hypertension: Secondary | ICD-10-CM | POA: Diagnosis not present

## 2013-03-17 DIAGNOSIS — Z125 Encounter for screening for malignant neoplasm of prostate: Secondary | ICD-10-CM | POA: Diagnosis not present

## 2013-03-17 DIAGNOSIS — IMO0001 Reserved for inherently not codable concepts without codable children: Secondary | ICD-10-CM | POA: Diagnosis not present

## 2013-06-05 DIAGNOSIS — E876 Hypokalemia: Secondary | ICD-10-CM | POA: Diagnosis not present

## 2013-06-05 DIAGNOSIS — E039 Hypothyroidism, unspecified: Secondary | ICD-10-CM | POA: Diagnosis not present

## 2013-06-05 DIAGNOSIS — M069 Rheumatoid arthritis, unspecified: Secondary | ICD-10-CM | POA: Diagnosis not present

## 2013-06-05 DIAGNOSIS — E1149 Type 2 diabetes mellitus with other diabetic neurological complication: Secondary | ICD-10-CM | POA: Diagnosis not present

## 2013-06-05 DIAGNOSIS — R252 Cramp and spasm: Secondary | ICD-10-CM | POA: Diagnosis not present

## 2013-06-09 DIAGNOSIS — M069 Rheumatoid arthritis, unspecified: Secondary | ICD-10-CM | POA: Diagnosis not present

## 2013-06-09 DIAGNOSIS — M19049 Primary osteoarthritis, unspecified hand: Secondary | ICD-10-CM | POA: Diagnosis not present

## 2013-06-15 DIAGNOSIS — I10 Essential (primary) hypertension: Secondary | ICD-10-CM | POA: Diagnosis not present

## 2013-06-15 DIAGNOSIS — Z6827 Body mass index (BMI) 27.0-27.9, adult: Secondary | ICD-10-CM | POA: Diagnosis not present

## 2013-06-15 DIAGNOSIS — Z9181 History of falling: Secondary | ICD-10-CM | POA: Diagnosis not present

## 2013-06-16 ENCOUNTER — Ambulatory Visit: Payer: Self-pay | Admitting: Orthopedic Surgery

## 2013-06-16 DIAGNOSIS — M79609 Pain in unspecified limb: Secondary | ICD-10-CM | POA: Diagnosis not present

## 2013-06-16 DIAGNOSIS — IMO0002 Reserved for concepts with insufficient information to code with codable children: Secondary | ICD-10-CM | POA: Diagnosis not present

## 2013-06-16 DIAGNOSIS — Z01810 Encounter for preprocedural cardiovascular examination: Secondary | ICD-10-CM | POA: Diagnosis not present

## 2013-06-16 DIAGNOSIS — Z79899 Other long term (current) drug therapy: Secondary | ICD-10-CM | POA: Diagnosis not present

## 2013-06-16 DIAGNOSIS — I10 Essential (primary) hypertension: Secondary | ICD-10-CM | POA: Diagnosis not present

## 2013-06-16 DIAGNOSIS — Z01812 Encounter for preprocedural laboratory examination: Secondary | ICD-10-CM | POA: Diagnosis not present

## 2013-06-16 LAB — CBC
HCT: 47.8 % (ref 40.0–52.0)
HGB: 15.6 g/dL (ref 13.0–18.0)
MCH: 30.2 pg (ref 26.0–34.0)
MCHC: 32.6 g/dL (ref 32.0–36.0)
MCV: 93 fL (ref 80–100)
PLATELETS: 293 10*3/uL (ref 150–440)
RBC: 5.17 10*6/uL (ref 4.40–5.90)
RDW: 13.4 % (ref 11.5–14.5)
WBC: 7.3 10*3/uL (ref 3.8–10.6)

## 2013-06-16 LAB — BASIC METABOLIC PANEL
Anion Gap: 4 — ABNORMAL LOW (ref 7–16)
BUN: 19 mg/dL — ABNORMAL HIGH (ref 7–18)
CO2: 26 mmol/L (ref 21–32)
CREATININE: 1.11 mg/dL (ref 0.60–1.30)
Calcium, Total: 9 mg/dL (ref 8.5–10.1)
Chloride: 109 mmol/L — ABNORMAL HIGH (ref 98–107)
EGFR (African American): >60
EGFR (Non-African Amer.): >60
Glucose: 121 mg/dL — ABNORMAL HIGH (ref 65–99)
Osmolality: 281 (ref 275–301)
POTASSIUM: 4.3 mmol/L (ref 3.5–5.1)
Sodium: 139 mmol/L (ref 136–145)

## 2013-06-16 LAB — SEDIMENTATION RATE: ERYTHROCYTE SED RATE: 6 mm/h (ref 0–20)

## 2013-06-16 LAB — PROTIME-INR
INR: 1
PROTHROMBIN TIME: 13.2 s (ref 11.5–14.7)

## 2013-06-16 LAB — URINALYSIS, COMPLETE
BILIRUBIN, UR: NEGATIVE
BLOOD: NEGATIVE
Bacteria: NONE SEEN
KETONE: NEGATIVE
Leukocyte Esterase: NEGATIVE
Nitrite: NEGATIVE
PH: 5 (ref 4.5–8.0)
PROTEIN: NEGATIVE
Specific Gravity: 1.009 (ref 1.003–1.030)
Squamous Epithelial: <1
WBC UR: 1 /HPF (ref 0–5)

## 2013-06-16 LAB — MRSA PCR SCREENING

## 2013-06-16 LAB — APTT: ACTIVATED PTT: 31.2 s (ref 23.6–35.9)

## 2013-06-18 LAB — URINE CULTURE

## 2013-06-23 DIAGNOSIS — I5032 Chronic diastolic (congestive) heart failure: Secondary | ICD-10-CM | POA: Diagnosis not present

## 2013-06-23 DIAGNOSIS — I110 Hypertensive heart disease with heart failure: Secondary | ICD-10-CM | POA: Diagnosis not present

## 2013-06-23 DIAGNOSIS — E1149 Type 2 diabetes mellitus with other diabetic neurological complication: Secondary | ICD-10-CM | POA: Diagnosis not present

## 2013-06-23 DIAGNOSIS — I951 Orthostatic hypotension: Secondary | ICD-10-CM | POA: Diagnosis not present

## 2013-06-23 DIAGNOSIS — I509 Heart failure, unspecified: Secondary | ICD-10-CM | POA: Diagnosis not present

## 2013-06-23 DIAGNOSIS — E785 Hyperlipidemia, unspecified: Secondary | ICD-10-CM | POA: Diagnosis not present

## 2013-06-23 DIAGNOSIS — E1142 Type 2 diabetes mellitus with diabetic polyneuropathy: Secondary | ICD-10-CM | POA: Diagnosis not present

## 2013-06-23 DIAGNOSIS — R0602 Shortness of breath: Secondary | ICD-10-CM | POA: Diagnosis not present

## 2013-06-23 DIAGNOSIS — I2510 Atherosclerotic heart disease of native coronary artery without angina pectoris: Secondary | ICD-10-CM | POA: Diagnosis not present

## 2013-08-03 DIAGNOSIS — M2669 Other specified disorders of temporomandibular joint: Secondary | ICD-10-CM | POA: Diagnosis not present

## 2013-08-03 DIAGNOSIS — Z6833 Body mass index (BMI) 33.0-33.9, adult: Secondary | ICD-10-CM | POA: Diagnosis not present

## 2013-08-03 DIAGNOSIS — R609 Edema, unspecified: Secondary | ICD-10-CM | POA: Diagnosis not present

## 2013-08-18 DIAGNOSIS — Z6832 Body mass index (BMI) 32.0-32.9, adult: Secondary | ICD-10-CM | POA: Diagnosis not present

## 2013-08-18 DIAGNOSIS — H1045 Other chronic allergic conjunctivitis: Secondary | ICD-10-CM | POA: Diagnosis not present

## 2013-09-22 DIAGNOSIS — I10 Essential (primary) hypertension: Secondary | ICD-10-CM | POA: Diagnosis not present

## 2013-09-22 DIAGNOSIS — E785 Hyperlipidemia, unspecified: Secondary | ICD-10-CM | POA: Diagnosis not present

## 2013-09-22 DIAGNOSIS — Z79899 Other long term (current) drug therapy: Secondary | ICD-10-CM | POA: Diagnosis not present

## 2013-09-22 DIAGNOSIS — Z6831 Body mass index (BMI) 31.0-31.9, adult: Secondary | ICD-10-CM | POA: Diagnosis not present

## 2013-09-22 DIAGNOSIS — E039 Hypothyroidism, unspecified: Secondary | ICD-10-CM | POA: Diagnosis not present

## 2013-09-22 DIAGNOSIS — E1149 Type 2 diabetes mellitus with other diabetic neurological complication: Secondary | ICD-10-CM | POA: Diagnosis not present

## 2013-11-13 IMAGING — CT CT OF THE RIGHT KNEE WITHOUT CONTRAST
3 of 7 series · 14 of 33 positions shown, 17 images · non-contrast
Comparison: Radiographs 02/25/2012

CLINICAL DATA: Preop for surgical planning right knee prosthesis.

EXAM:
CT OF THE RIGHT KNEE WITHOUT CONTRAST
TECHNIQUE: Multidetector CT imaging was performed according to the standard
protocol. Multiplanar CT image reconstructions were also generated.

[Series 7: knee · axial · 0.39mm/px · z∈[+384,+563]mm · 6 of 502 slices shown, 8 images]
[im 72/502  soft-tissue]
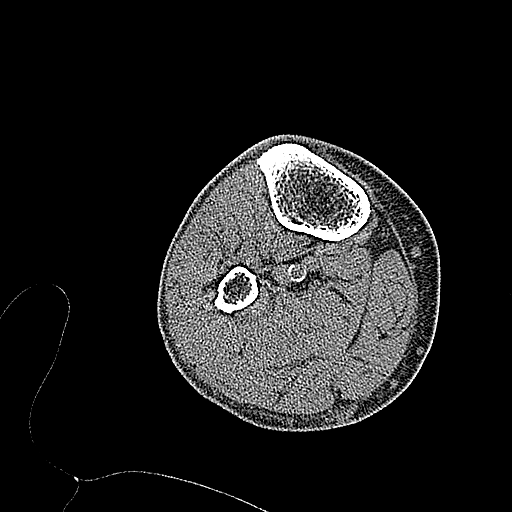
[im 72/502  bone]
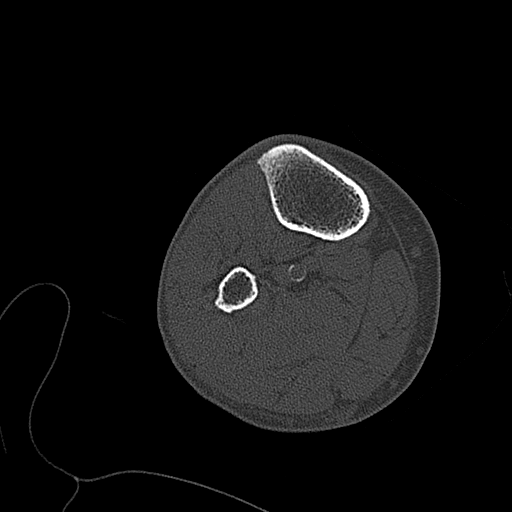
[im 144/502  bone]
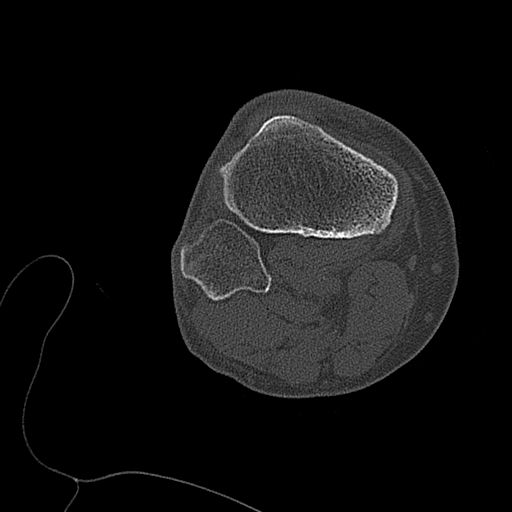
[im 215/502  bone]
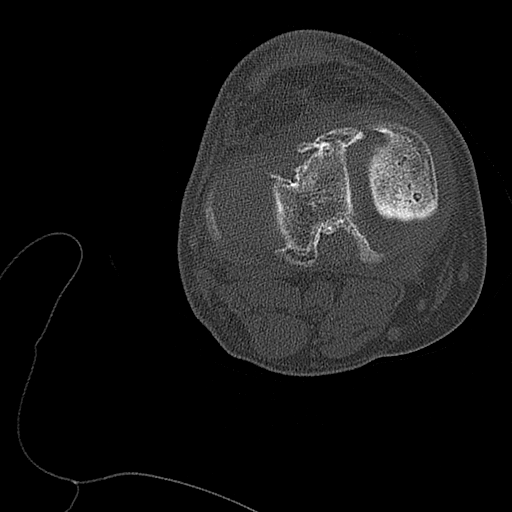
[im 287/502  bone]
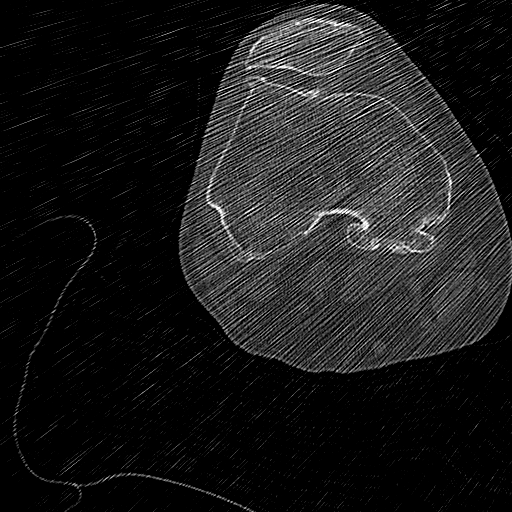
[im 358/502  soft-tissue]
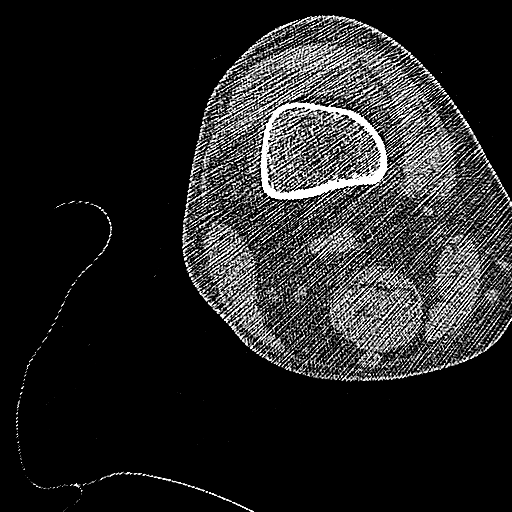
[im 358/502  bone]
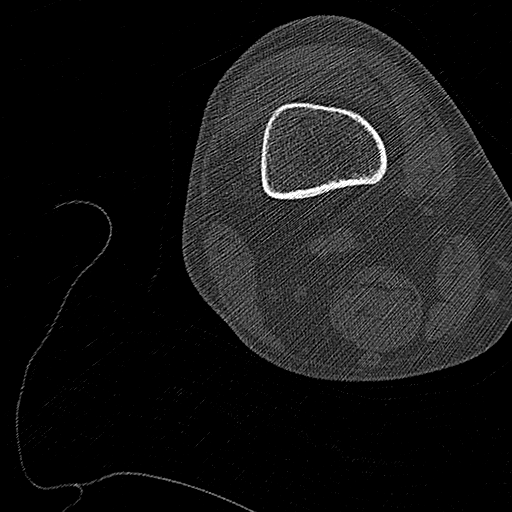
[im 430/502  bone]
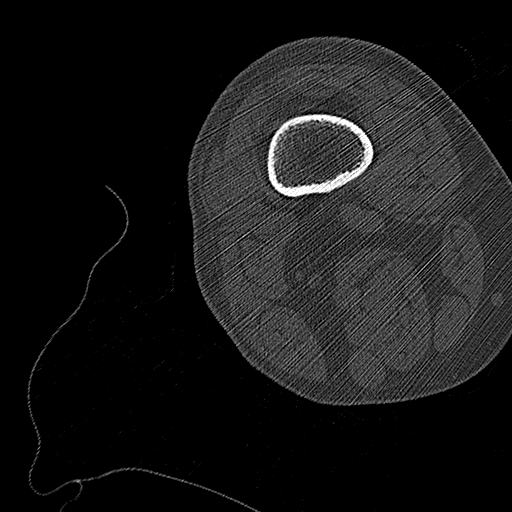

[Series 602: sag soft · sagittal · 0.49mm/px · 5 of 57 slices shown, 6 images]
[im 19/57  bone]
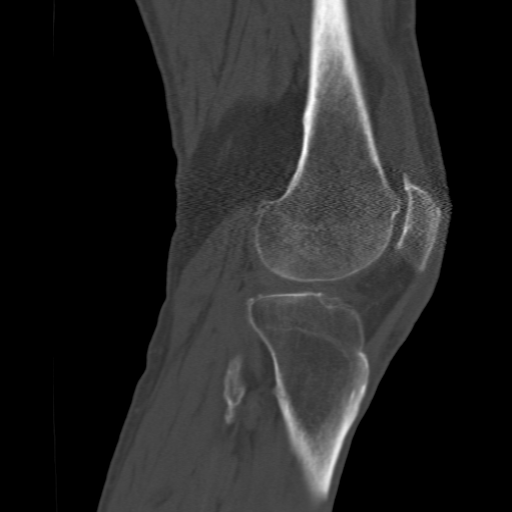
[im 24/57  bone]
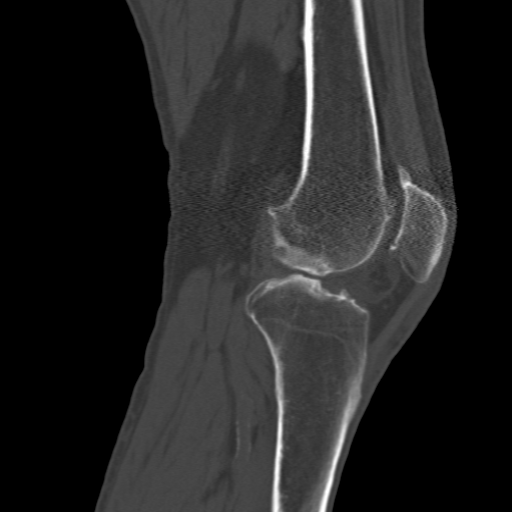
[im 29/57  soft-tissue]
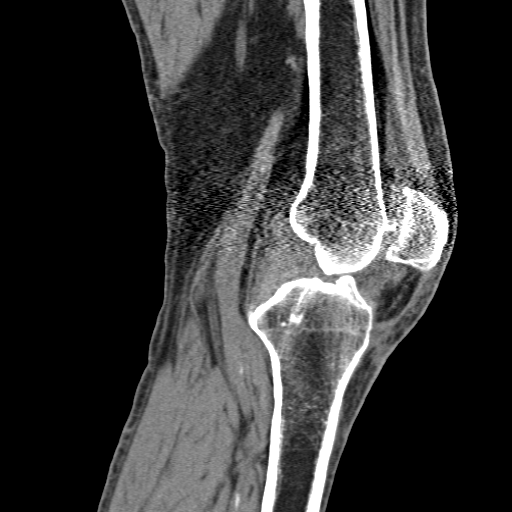
[im 29/57  bone]
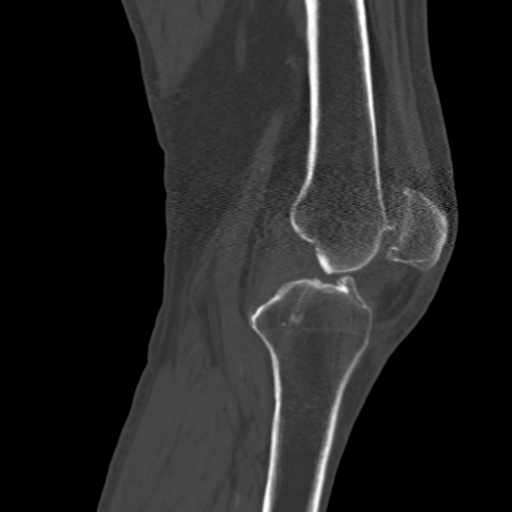
[im 33/57  bone]
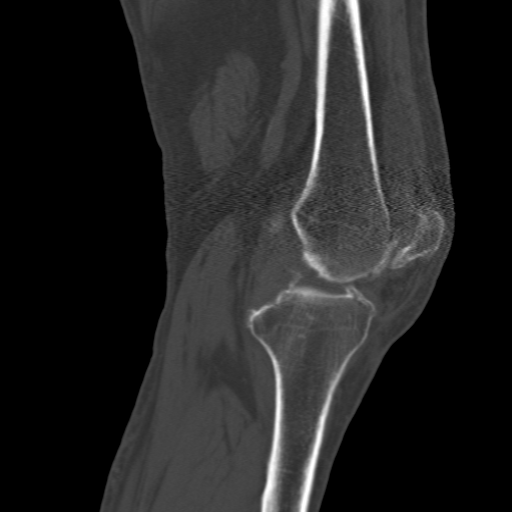
[im 38/57  bone]
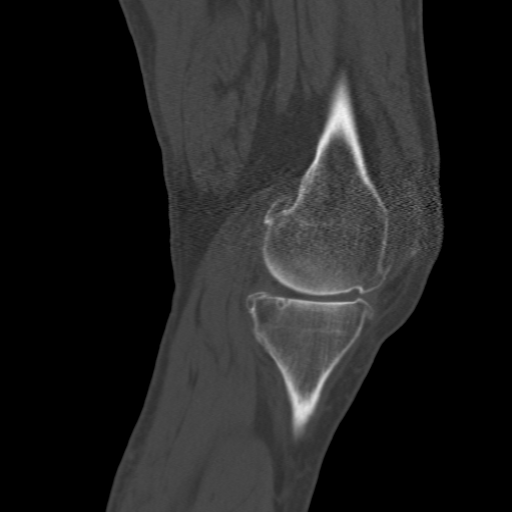

[Series 603: cor soft · coronal · 0.49mm/px · 3 of 61 slices shown]
[im 13/61  bone]
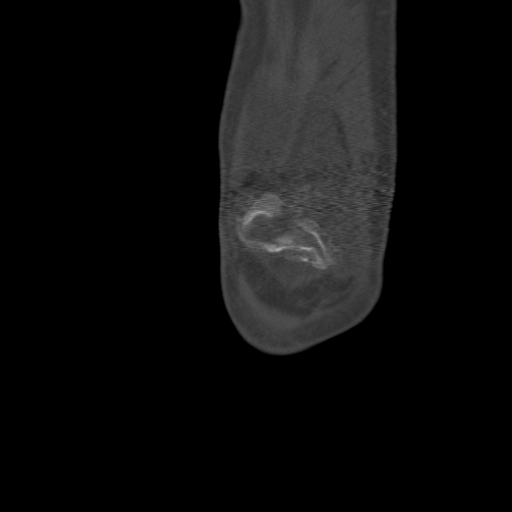
[im 25/61  bone]
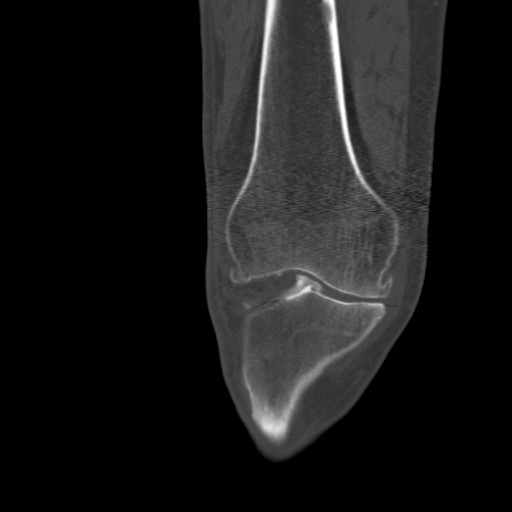
[im 37/61  bone]
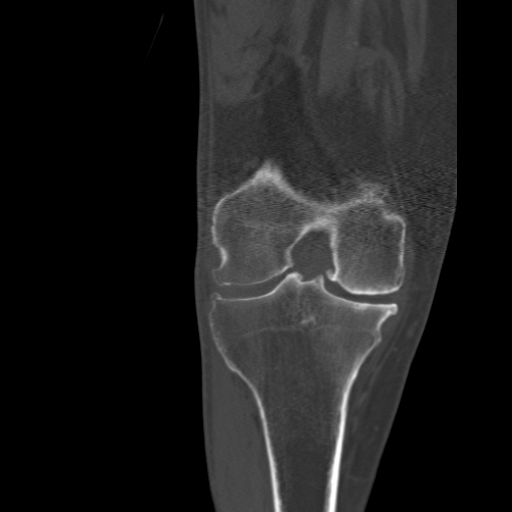

[14 of 33 positions shown; findings below may reference images not displayed]

FINDINGS: Right hip: Axial images of the right hip demonstrate mild
degenerative changes. Subchondral cystic changes and slight bony
prominence near the femoral head neck junction region can be seen
with femoral acetabular impingement. The head appears normal size
for the acetabulum. No findings for avascular necrosis.

Right knee: Moderate to advanced tricompartmental degenerative
changes with joint space narrowing, osteophytic spurring, bony
eburnation and subchondral cystic change. A small joint effusion is
noted. No osteochondral lesion or fracture. Vascular calcifications
are noted

Right ankle: Axial images of the ankle demonstrate mild degenerative
changes. No fracture or osteochondral abnormality.
IMPRESSION: Significant tricompartmental degenerative changes involving the
knee.

## 2013-11-19 DIAGNOSIS — E039 Hypothyroidism, unspecified: Secondary | ICD-10-CM | POA: Diagnosis not present

## 2013-11-19 DIAGNOSIS — F419 Anxiety disorder, unspecified: Secondary | ICD-10-CM | POA: Diagnosis not present

## 2013-11-19 DIAGNOSIS — R251 Tremor, unspecified: Secondary | ICD-10-CM | POA: Diagnosis not present

## 2013-11-19 DIAGNOSIS — Z6831 Body mass index (BMI) 31.0-31.9, adult: Secondary | ICD-10-CM | POA: Diagnosis not present

## 2013-12-23 DIAGNOSIS — I110 Hypertensive heart disease with heart failure: Secondary | ICD-10-CM | POA: Diagnosis not present

## 2013-12-23 DIAGNOSIS — E785 Hyperlipidemia, unspecified: Secondary | ICD-10-CM | POA: Diagnosis not present

## 2013-12-23 DIAGNOSIS — I5032 Chronic diastolic (congestive) heart failure: Secondary | ICD-10-CM | POA: Diagnosis not present

## 2013-12-23 DIAGNOSIS — I2510 Atherosclerotic heart disease of native coronary artery without angina pectoris: Secondary | ICD-10-CM | POA: Diagnosis not present

## 2013-12-27 DIAGNOSIS — I10 Essential (primary) hypertension: Secondary | ICD-10-CM | POA: Diagnosis not present

## 2013-12-27 DIAGNOSIS — Z6831 Body mass index (BMI) 31.0-31.9, adult: Secondary | ICD-10-CM | POA: Diagnosis not present

## 2013-12-27 DIAGNOSIS — E1140 Type 2 diabetes mellitus with diabetic neuropathy, unspecified: Secondary | ICD-10-CM | POA: Diagnosis not present

## 2013-12-27 DIAGNOSIS — I2510 Atherosclerotic heart disease of native coronary artery without angina pectoris: Secondary | ICD-10-CM | POA: Diagnosis not present

## 2013-12-27 DIAGNOSIS — E039 Hypothyroidism, unspecified: Secondary | ICD-10-CM | POA: Diagnosis not present

## 2013-12-27 DIAGNOSIS — E785 Hyperlipidemia, unspecified: Secondary | ICD-10-CM | POA: Diagnosis not present

## 2013-12-27 DIAGNOSIS — Z79899 Other long term (current) drug therapy: Secondary | ICD-10-CM | POA: Diagnosis not present

## 2014-01-13 DIAGNOSIS — J209 Acute bronchitis, unspecified: Secondary | ICD-10-CM | POA: Diagnosis not present

## 2014-01-13 DIAGNOSIS — Z6832 Body mass index (BMI) 32.0-32.9, adult: Secondary | ICD-10-CM | POA: Diagnosis not present

## 2014-01-13 DIAGNOSIS — E1140 Type 2 diabetes mellitus with diabetic neuropathy, unspecified: Secondary | ICD-10-CM | POA: Diagnosis not present

## 2014-05-23 DIAGNOSIS — M1711 Unilateral primary osteoarthritis, right knee: Secondary | ICD-10-CM | POA: Diagnosis not present

## 2014-05-23 DIAGNOSIS — Z6832 Body mass index (BMI) 32.0-32.9, adult: Secondary | ICD-10-CM | POA: Diagnosis not present

## 2014-05-23 DIAGNOSIS — I10 Essential (primary) hypertension: Secondary | ICD-10-CM | POA: Diagnosis not present

## 2014-05-23 DIAGNOSIS — I5032 Chronic diastolic (congestive) heart failure: Secondary | ICD-10-CM | POA: Diagnosis not present

## 2014-05-23 DIAGNOSIS — E1140 Type 2 diabetes mellitus with diabetic neuropathy, unspecified: Secondary | ICD-10-CM | POA: Diagnosis not present

## 2014-05-23 DIAGNOSIS — E039 Hypothyroidism, unspecified: Secondary | ICD-10-CM | POA: Diagnosis not present

## 2014-05-23 DIAGNOSIS — E785 Hyperlipidemia, unspecified: Secondary | ICD-10-CM | POA: Diagnosis not present

## 2014-05-23 DIAGNOSIS — I2510 Atherosclerotic heart disease of native coronary artery without angina pectoris: Secondary | ICD-10-CM | POA: Diagnosis not present

## 2014-05-24 DIAGNOSIS — Z79899 Other long term (current) drug therapy: Secondary | ICD-10-CM | POA: Diagnosis not present

## 2014-05-24 DIAGNOSIS — E039 Hypothyroidism, unspecified: Secondary | ICD-10-CM | POA: Diagnosis not present

## 2014-05-24 DIAGNOSIS — Z125 Encounter for screening for malignant neoplasm of prostate: Secondary | ICD-10-CM | POA: Diagnosis not present

## 2014-05-24 DIAGNOSIS — E1140 Type 2 diabetes mellitus with diabetic neuropathy, unspecified: Secondary | ICD-10-CM | POA: Diagnosis not present

## 2014-05-24 DIAGNOSIS — E785 Hyperlipidemia, unspecified: Secondary | ICD-10-CM | POA: Diagnosis not present

## 2014-05-30 DIAGNOSIS — Z01810 Encounter for preprocedural cardiovascular examination: Secondary | ICD-10-CM | POA: Insufficient documentation

## 2014-05-31 DIAGNOSIS — Z01810 Encounter for preprocedural cardiovascular examination: Secondary | ICD-10-CM | POA: Diagnosis not present

## 2014-05-31 DIAGNOSIS — I480 Paroxysmal atrial fibrillation: Secondary | ICD-10-CM | POA: Diagnosis not present

## 2014-06-16 ENCOUNTER — Encounter (INDEPENDENT_AMBULATORY_CARE_PROVIDER_SITE_OTHER): Payer: Medicare Other | Admitting: Ophthalmology

## 2014-06-16 DIAGNOSIS — H2513 Age-related nuclear cataract, bilateral: Secondary | ICD-10-CM | POA: Diagnosis not present

## 2014-06-16 DIAGNOSIS — H43813 Vitreous degeneration, bilateral: Secondary | ICD-10-CM | POA: Diagnosis not present

## 2014-06-16 DIAGNOSIS — I10 Essential (primary) hypertension: Secondary | ICD-10-CM | POA: Diagnosis not present

## 2014-06-16 DIAGNOSIS — H35033 Hypertensive retinopathy, bilateral: Secondary | ICD-10-CM

## 2014-06-16 DIAGNOSIS — H34831 Tributary (branch) retinal vein occlusion, right eye: Secondary | ICD-10-CM | POA: Diagnosis not present

## 2014-07-08 ENCOUNTER — Encounter (INDEPENDENT_AMBULATORY_CARE_PROVIDER_SITE_OTHER): Payer: Medicare Other | Admitting: Ophthalmology

## 2014-07-08 DIAGNOSIS — I10 Essential (primary) hypertension: Secondary | ICD-10-CM | POA: Diagnosis not present

## 2014-07-08 DIAGNOSIS — H43813 Vitreous degeneration, bilateral: Secondary | ICD-10-CM

## 2014-07-08 DIAGNOSIS — H35033 Hypertensive retinopathy, bilateral: Secondary | ICD-10-CM | POA: Diagnosis not present

## 2014-07-08 DIAGNOSIS — H34831 Tributary (branch) retinal vein occlusion, right eye: Secondary | ICD-10-CM | POA: Diagnosis not present

## 2014-08-12 DIAGNOSIS — R471 Dysarthria and anarthria: Secondary | ICD-10-CM | POA: Diagnosis not present

## 2014-08-12 DIAGNOSIS — E780 Pure hypercholesterolemia: Secondary | ICD-10-CM | POA: Diagnosis present

## 2014-08-12 DIAGNOSIS — R42 Dizziness and giddiness: Secondary | ICD-10-CM | POA: Diagnosis not present

## 2014-08-12 DIAGNOSIS — R200 Anesthesia of skin: Secondary | ICD-10-CM | POA: Diagnosis not present

## 2014-08-12 DIAGNOSIS — I509 Heart failure, unspecified: Secondary | ICD-10-CM | POA: Diagnosis not present

## 2014-08-12 DIAGNOSIS — E119 Type 2 diabetes mellitus without complications: Secondary | ICD-10-CM | POA: Diagnosis not present

## 2014-08-12 DIAGNOSIS — R270 Ataxia, unspecified: Secondary | ICD-10-CM | POA: Diagnosis not present

## 2014-08-12 DIAGNOSIS — E785 Hyperlipidemia, unspecified: Secondary | ICD-10-CM | POA: Diagnosis present

## 2014-08-12 DIAGNOSIS — E118 Type 2 diabetes mellitus with unspecified complications: Secondary | ICD-10-CM | POA: Diagnosis not present

## 2014-08-12 DIAGNOSIS — Z79899 Other long term (current) drug therapy: Secondary | ICD-10-CM | POA: Diagnosis not present

## 2014-08-12 DIAGNOSIS — E039 Hypothyroidism, unspecified: Secondary | ICD-10-CM | POA: Diagnosis present

## 2014-08-12 DIAGNOSIS — E860 Dehydration: Secondary | ICD-10-CM | POA: Diagnosis not present

## 2014-08-12 DIAGNOSIS — J9811 Atelectasis: Secondary | ICD-10-CM | POA: Diagnosis not present

## 2014-08-12 DIAGNOSIS — Z794 Long term (current) use of insulin: Secondary | ICD-10-CM | POA: Diagnosis not present

## 2014-08-12 DIAGNOSIS — I639 Cerebral infarction, unspecified: Secondary | ICD-10-CM | POA: Diagnosis not present

## 2014-08-12 DIAGNOSIS — I2510 Atherosclerotic heart disease of native coronary artery without angina pectoris: Secondary | ICD-10-CM | POA: Diagnosis not present

## 2014-08-12 DIAGNOSIS — I10 Essential (primary) hypertension: Secondary | ICD-10-CM | POA: Diagnosis not present

## 2014-08-12 DIAGNOSIS — G8194 Hemiplegia, unspecified affecting left nondominant side: Secondary | ICD-10-CM | POA: Diagnosis present

## 2014-08-12 DIAGNOSIS — I6523 Occlusion and stenosis of bilateral carotid arteries: Secondary | ICD-10-CM | POA: Diagnosis not present

## 2014-08-12 DIAGNOSIS — Z888 Allergy status to other drugs, medicaments and biological substances status: Secondary | ICD-10-CM | POA: Diagnosis not present

## 2014-08-12 DIAGNOSIS — I252 Old myocardial infarction: Secondary | ICD-10-CM | POA: Diagnosis not present

## 2014-08-12 DIAGNOSIS — K219 Gastro-esophageal reflux disease without esophagitis: Secondary | ICD-10-CM | POA: Diagnosis present

## 2014-08-15 DIAGNOSIS — Z6831 Body mass index (BMI) 31.0-31.9, adult: Secondary | ICD-10-CM | POA: Diagnosis not present

## 2014-08-15 DIAGNOSIS — I63511 Cerebral infarction due to unspecified occlusion or stenosis of right middle cerebral artery: Secondary | ICD-10-CM | POA: Diagnosis not present

## 2014-08-15 DIAGNOSIS — E1140 Type 2 diabetes mellitus with diabetic neuropathy, unspecified: Secondary | ICD-10-CM | POA: Diagnosis not present

## 2014-08-15 DIAGNOSIS — E785 Hyperlipidemia, unspecified: Secondary | ICD-10-CM | POA: Diagnosis not present

## 2014-08-15 DIAGNOSIS — I10 Essential (primary) hypertension: Secondary | ICD-10-CM | POA: Diagnosis not present

## 2014-08-16 DIAGNOSIS — I2510 Atherosclerotic heart disease of native coronary artery without angina pectoris: Secondary | ICD-10-CM | POA: Diagnosis not present

## 2014-08-16 DIAGNOSIS — Z6831 Body mass index (BMI) 31.0-31.9, adult: Secondary | ICD-10-CM | POA: Diagnosis not present

## 2014-08-16 DIAGNOSIS — I10 Essential (primary) hypertension: Secondary | ICD-10-CM | POA: Diagnosis not present

## 2014-08-16 DIAGNOSIS — Z9181 History of falling: Secondary | ICD-10-CM | POA: Diagnosis not present

## 2014-08-26 ENCOUNTER — Encounter (INDEPENDENT_AMBULATORY_CARE_PROVIDER_SITE_OTHER): Payer: Medicare Other | Admitting: Ophthalmology

## 2014-08-26 DIAGNOSIS — H35033 Hypertensive retinopathy, bilateral: Secondary | ICD-10-CM | POA: Diagnosis not present

## 2014-08-26 DIAGNOSIS — H34831 Tributary (branch) retinal vein occlusion, right eye: Secondary | ICD-10-CM | POA: Diagnosis not present

## 2014-08-26 DIAGNOSIS — I10 Essential (primary) hypertension: Secondary | ICD-10-CM | POA: Diagnosis not present

## 2014-08-26 DIAGNOSIS — H43813 Vitreous degeneration, bilateral: Secondary | ICD-10-CM | POA: Diagnosis not present

## 2014-09-06 DIAGNOSIS — I639 Cerebral infarction, unspecified: Secondary | ICD-10-CM | POA: Diagnosis not present

## 2014-09-06 DIAGNOSIS — I10 Essential (primary) hypertension: Secondary | ICD-10-CM | POA: Diagnosis not present

## 2014-09-21 DIAGNOSIS — I10 Essential (primary) hypertension: Secondary | ICD-10-CM | POA: Diagnosis not present

## 2014-09-21 DIAGNOSIS — Z6832 Body mass index (BMI) 32.0-32.9, adult: Secondary | ICD-10-CM | POA: Diagnosis not present

## 2014-10-14 ENCOUNTER — Encounter (INDEPENDENT_AMBULATORY_CARE_PROVIDER_SITE_OTHER): Payer: Medicare Other | Admitting: Ophthalmology

## 2014-10-14 DIAGNOSIS — I10 Essential (primary) hypertension: Secondary | ICD-10-CM | POA: Diagnosis not present

## 2014-10-14 DIAGNOSIS — H35033 Hypertensive retinopathy, bilateral: Secondary | ICD-10-CM

## 2014-10-14 DIAGNOSIS — H43813 Vitreous degeneration, bilateral: Secondary | ICD-10-CM

## 2014-10-14 DIAGNOSIS — H34831 Tributary (branch) retinal vein occlusion, right eye, with macular edema: Secondary | ICD-10-CM

## 2014-11-09 ENCOUNTER — Encounter (INDEPENDENT_AMBULATORY_CARE_PROVIDER_SITE_OTHER): Payer: Medicare Other | Admitting: Ophthalmology

## 2014-12-21 DIAGNOSIS — E663 Overweight: Secondary | ICD-10-CM | POA: Diagnosis not present

## 2014-12-21 DIAGNOSIS — E039 Hypothyroidism, unspecified: Secondary | ICD-10-CM | POA: Diagnosis not present

## 2014-12-21 DIAGNOSIS — F419 Anxiety disorder, unspecified: Secondary | ICD-10-CM | POA: Diagnosis not present

## 2014-12-21 DIAGNOSIS — Z6829 Body mass index (BMI) 29.0-29.9, adult: Secondary | ICD-10-CM | POA: Diagnosis not present

## 2015-01-25 DIAGNOSIS — R55 Syncope and collapse: Secondary | ICD-10-CM | POA: Diagnosis not present

## 2015-01-25 DIAGNOSIS — J811 Chronic pulmonary edema: Secondary | ICD-10-CM | POA: Diagnosis not present

## 2015-01-25 DIAGNOSIS — S0101XA Laceration without foreign body of scalp, initial encounter: Secondary | ICD-10-CM | POA: Diagnosis not present

## 2015-01-25 DIAGNOSIS — S0990XA Unspecified injury of head, initial encounter: Secondary | ICD-10-CM | POA: Diagnosis not present

## 2015-03-29 DIAGNOSIS — E039 Hypothyroidism, unspecified: Secondary | ICD-10-CM | POA: Diagnosis not present

## 2015-03-29 DIAGNOSIS — E1140 Type 2 diabetes mellitus with diabetic neuropathy, unspecified: Secondary | ICD-10-CM | POA: Diagnosis not present

## 2015-03-29 DIAGNOSIS — Z6828 Body mass index (BMI) 28.0-28.9, adult: Secondary | ICD-10-CM | POA: Diagnosis not present

## 2015-03-29 DIAGNOSIS — E785 Hyperlipidemia, unspecified: Secondary | ICD-10-CM | POA: Diagnosis not present

## 2015-03-29 DIAGNOSIS — I10 Essential (primary) hypertension: Secondary | ICD-10-CM | POA: Diagnosis not present

## 2015-03-29 DIAGNOSIS — F4310 Post-traumatic stress disorder, unspecified: Secondary | ICD-10-CM | POA: Diagnosis not present

## 2015-04-01 DIAGNOSIS — Z6828 Body mass index (BMI) 28.0-28.9, adult: Secondary | ICD-10-CM | POA: Diagnosis not present

## 2015-04-01 DIAGNOSIS — M545 Low back pain: Secondary | ICD-10-CM | POA: Diagnosis not present

## 2015-05-01 DIAGNOSIS — M455 Ankylosing spondylitis of thoracolumbar region: Secondary | ICD-10-CM | POA: Diagnosis not present

## 2015-05-01 DIAGNOSIS — E538 Deficiency of other specified B group vitamins: Secondary | ICD-10-CM | POA: Diagnosis not present

## 2015-05-01 DIAGNOSIS — E663 Overweight: Secondary | ICD-10-CM | POA: Diagnosis not present

## 2015-05-01 DIAGNOSIS — Z6827 Body mass index (BMI) 27.0-27.9, adult: Secondary | ICD-10-CM | POA: Diagnosis not present

## 2015-05-01 DIAGNOSIS — R5382 Chronic fatigue, unspecified: Secondary | ICD-10-CM | POA: Diagnosis not present

## 2015-05-01 DIAGNOSIS — D649 Anemia, unspecified: Secondary | ICD-10-CM | POA: Diagnosis not present

## 2015-05-18 DIAGNOSIS — R319 Hematuria, unspecified: Secondary | ICD-10-CM | POA: Diagnosis not present

## 2015-05-18 DIAGNOSIS — N401 Enlarged prostate with lower urinary tract symptoms: Secondary | ICD-10-CM | POA: Diagnosis not present

## 2015-05-29 DIAGNOSIS — Z981 Arthrodesis status: Secondary | ICD-10-CM | POA: Diagnosis not present

## 2015-05-29 DIAGNOSIS — M5137 Other intervertebral disc degeneration, lumbosacral region: Secondary | ICD-10-CM | POA: Diagnosis not present

## 2015-05-29 DIAGNOSIS — S32028A Other fracture of second lumbar vertebra, initial encounter for closed fracture: Secondary | ICD-10-CM | POA: Diagnosis not present

## 2015-05-29 DIAGNOSIS — M4806 Spinal stenosis, lumbar region: Secondary | ICD-10-CM | POA: Diagnosis not present

## 2015-05-29 DIAGNOSIS — S32018A Other fracture of first lumbar vertebra, initial encounter for closed fracture: Secondary | ICD-10-CM | POA: Diagnosis not present

## 2015-05-29 DIAGNOSIS — M1611 Unilateral primary osteoarthritis, right hip: Secondary | ICD-10-CM | POA: Diagnosis not present

## 2015-05-29 DIAGNOSIS — M25551 Pain in right hip: Secondary | ICD-10-CM | POA: Diagnosis not present

## 2015-05-31 DIAGNOSIS — S32008A Other fracture of unspecified lumbar vertebra, initial encounter for closed fracture: Secondary | ICD-10-CM | POA: Diagnosis not present

## 2015-05-31 DIAGNOSIS — M25551 Pain in right hip: Secondary | ICD-10-CM | POA: Diagnosis not present

## 2015-06-12 DIAGNOSIS — L814 Other melanin hyperpigmentation: Secondary | ICD-10-CM | POA: Diagnosis not present

## 2015-06-12 DIAGNOSIS — C44319 Basal cell carcinoma of skin of other parts of face: Secondary | ICD-10-CM | POA: Diagnosis not present

## 2015-06-12 DIAGNOSIS — L821 Other seborrheic keratosis: Secondary | ICD-10-CM | POA: Diagnosis not present

## 2015-06-22 DIAGNOSIS — S32008A Other fracture of unspecified lumbar vertebra, initial encounter for closed fracture: Secondary | ICD-10-CM | POA: Diagnosis not present

## 2015-07-10 DIAGNOSIS — I8390 Asymptomatic varicose veins of unspecified lower extremity: Secondary | ICD-10-CM | POA: Diagnosis not present

## 2015-07-10 DIAGNOSIS — I10 Essential (primary) hypertension: Secondary | ICD-10-CM | POA: Diagnosis not present

## 2015-07-12 DIAGNOSIS — L578 Other skin changes due to chronic exposure to nonionizing radiation: Secondary | ICD-10-CM | POA: Diagnosis not present

## 2015-07-12 DIAGNOSIS — D485 Neoplasm of uncertain behavior of skin: Secondary | ICD-10-CM | POA: Diagnosis not present

## 2015-07-12 DIAGNOSIS — L300 Nummular dermatitis: Secondary | ICD-10-CM | POA: Diagnosis not present

## 2015-07-12 DIAGNOSIS — I781 Nevus, non-neoplastic: Secondary | ICD-10-CM | POA: Diagnosis not present

## 2015-07-22 DIAGNOSIS — M79606 Pain in leg, unspecified: Secondary | ICD-10-CM | POA: Diagnosis not present

## 2015-07-22 DIAGNOSIS — M79605 Pain in left leg: Secondary | ICD-10-CM | POA: Diagnosis not present

## 2015-07-22 DIAGNOSIS — M79661 Pain in right lower leg: Secondary | ICD-10-CM | POA: Diagnosis not present

## 2015-07-22 DIAGNOSIS — M79662 Pain in left lower leg: Secondary | ICD-10-CM | POA: Diagnosis not present

## 2015-07-27 DIAGNOSIS — R319 Hematuria, unspecified: Secondary | ICD-10-CM | POA: Diagnosis not present

## 2015-07-27 DIAGNOSIS — N401 Enlarged prostate with lower urinary tract symptoms: Secondary | ICD-10-CM | POA: Diagnosis not present

## 2015-07-27 DIAGNOSIS — R339 Retention of urine, unspecified: Secondary | ICD-10-CM | POA: Diagnosis not present

## 2015-08-18 DIAGNOSIS — L300 Nummular dermatitis: Secondary | ICD-10-CM | POA: Diagnosis not present

## 2015-08-23 DIAGNOSIS — L578 Other skin changes due to chronic exposure to nonionizing radiation: Secondary | ICD-10-CM | POA: Diagnosis not present

## 2015-08-23 DIAGNOSIS — D225 Melanocytic nevi of trunk: Secondary | ICD-10-CM | POA: Diagnosis not present

## 2015-08-23 DIAGNOSIS — R233 Spontaneous ecchymoses: Secondary | ICD-10-CM | POA: Diagnosis not present

## 2015-09-12 DIAGNOSIS — N401 Enlarged prostate with lower urinary tract symptoms: Secondary | ICD-10-CM | POA: Diagnosis not present

## 2015-09-12 DIAGNOSIS — R399 Unspecified symptoms and signs involving the genitourinary system: Secondary | ICD-10-CM | POA: Diagnosis not present

## 2015-09-12 DIAGNOSIS — G4700 Insomnia, unspecified: Secondary | ICD-10-CM | POA: Diagnosis not present

## 2015-10-05 DIAGNOSIS — S161XXA Strain of muscle, fascia and tendon at neck level, initial encounter: Secondary | ICD-10-CM | POA: Diagnosis not present

## 2015-10-05 DIAGNOSIS — N401 Enlarged prostate with lower urinary tract symptoms: Secondary | ICD-10-CM | POA: Diagnosis not present

## 2015-10-05 DIAGNOSIS — N390 Urinary tract infection, site not specified: Secondary | ICD-10-CM | POA: Diagnosis not present

## 2015-11-03 DIAGNOSIS — D2239 Melanocytic nevi of other parts of face: Secondary | ICD-10-CM | POA: Diagnosis not present

## 2015-11-10 DIAGNOSIS — J208 Acute bronchitis due to other specified organisms: Secondary | ICD-10-CM | POA: Diagnosis not present

## 2015-11-10 DIAGNOSIS — J300 Vasomotor rhinitis: Secondary | ICD-10-CM | POA: Diagnosis not present

## 2016-01-12 DIAGNOSIS — S0181XA Laceration without foreign body of other part of head, initial encounter: Secondary | ICD-10-CM | POA: Diagnosis not present

## 2016-02-22 DIAGNOSIS — L219 Seborrheic dermatitis, unspecified: Secondary | ICD-10-CM | POA: Diagnosis not present

## 2016-02-22 DIAGNOSIS — C44319 Basal cell carcinoma of skin of other parts of face: Secondary | ICD-10-CM | POA: Diagnosis not present

## 2016-02-22 DIAGNOSIS — L209 Atopic dermatitis, unspecified: Secondary | ICD-10-CM | POA: Diagnosis not present

## 2016-03-07 DIAGNOSIS — S6992XA Unspecified injury of left wrist, hand and finger(s), initial encounter: Secondary | ICD-10-CM | POA: Diagnosis not present

## 2016-03-07 DIAGNOSIS — M79602 Pain in left arm: Secondary | ICD-10-CM | POA: Diagnosis not present

## 2016-03-07 DIAGNOSIS — S0990XA Unspecified injury of head, initial encounter: Secondary | ICD-10-CM | POA: Diagnosis not present

## 2016-03-07 DIAGNOSIS — R404 Transient alteration of awareness: Secondary | ICD-10-CM | POA: Diagnosis not present

## 2016-03-07 DIAGNOSIS — S299XXA Unspecified injury of thorax, initial encounter: Secondary | ICD-10-CM | POA: Diagnosis not present

## 2016-03-07 DIAGNOSIS — S59902A Unspecified injury of left elbow, initial encounter: Secondary | ICD-10-CM | POA: Diagnosis not present

## 2016-03-07 DIAGNOSIS — S5002XA Contusion of left elbow, initial encounter: Secondary | ICD-10-CM | POA: Diagnosis not present

## 2016-03-07 DIAGNOSIS — I160 Hypertensive urgency: Secondary | ICD-10-CM | POA: Diagnosis not present

## 2016-03-07 DIAGNOSIS — S4992XA Unspecified injury of left shoulder and upper arm, initial encounter: Secondary | ICD-10-CM | POA: Diagnosis not present

## 2016-03-07 DIAGNOSIS — R270 Ataxia, unspecified: Secondary | ICD-10-CM | POA: Diagnosis not present

## 2016-03-07 DIAGNOSIS — I161 Hypertensive emergency: Secondary | ICD-10-CM | POA: Diagnosis not present

## 2016-03-07 DIAGNOSIS — M25522 Pain in left elbow: Secondary | ICD-10-CM | POA: Diagnosis not present

## 2016-03-07 DIAGNOSIS — S3993XA Unspecified injury of pelvis, initial encounter: Secondary | ICD-10-CM | POA: Diagnosis not present

## 2016-03-07 DIAGNOSIS — R531 Weakness: Secondary | ICD-10-CM | POA: Diagnosis not present

## 2016-03-07 DIAGNOSIS — M79642 Pain in left hand: Secondary | ICD-10-CM | POA: Diagnosis not present

## 2016-03-07 DIAGNOSIS — R262 Difficulty in walking, not elsewhere classified: Secondary | ICD-10-CM | POA: Diagnosis not present

## 2016-03-07 DIAGNOSIS — M79603 Pain in arm, unspecified: Secondary | ICD-10-CM | POA: Diagnosis not present

## 2016-03-20 DIAGNOSIS — I161 Hypertensive emergency: Secondary | ICD-10-CM | POA: Diagnosis not present

## 2016-03-20 DIAGNOSIS — R262 Difficulty in walking, not elsewhere classified: Secondary | ICD-10-CM | POA: Diagnosis not present

## 2016-03-20 DIAGNOSIS — S5012XA Contusion of left forearm, initial encounter: Secondary | ICD-10-CM | POA: Diagnosis not present

## 2016-07-05 DIAGNOSIS — K580 Irritable bowel syndrome with diarrhea: Secondary | ICD-10-CM | POA: Diagnosis not present

## 2016-07-05 DIAGNOSIS — I10 Essential (primary) hypertension: Secondary | ICD-10-CM | POA: Diagnosis not present

## 2016-11-07 DIAGNOSIS — M545 Low back pain: Secondary | ICD-10-CM | POA: Diagnosis not present

## 2016-11-07 DIAGNOSIS — R42 Dizziness and giddiness: Secondary | ICD-10-CM | POA: Diagnosis not present

## 2016-11-08 DIAGNOSIS — G4700 Insomnia, unspecified: Secondary | ICD-10-CM | POA: Diagnosis not present

## 2016-11-08 DIAGNOSIS — S3992XA Unspecified injury of lower back, initial encounter: Secondary | ICD-10-CM | POA: Diagnosis not present

## 2016-11-08 DIAGNOSIS — I2510 Atherosclerotic heart disease of native coronary artery without angina pectoris: Secondary | ICD-10-CM | POA: Diagnosis not present

## 2016-11-08 DIAGNOSIS — M545 Low back pain: Secondary | ICD-10-CM | POA: Diagnosis not present

## 2016-11-08 DIAGNOSIS — K580 Irritable bowel syndrome with diarrhea: Secondary | ICD-10-CM | POA: Diagnosis not present

## 2016-11-09 DIAGNOSIS — S8010XA Contusion of unspecified lower leg, initial encounter: Secondary | ICD-10-CM | POA: Diagnosis not present

## 2016-11-09 DIAGNOSIS — R52 Pain, unspecified: Secondary | ICD-10-CM | POA: Diagnosis not present

## 2016-11-09 DIAGNOSIS — M546 Pain in thoracic spine: Secondary | ICD-10-CM | POA: Diagnosis not present

## 2016-11-09 DIAGNOSIS — R269 Unspecified abnormalities of gait and mobility: Secondary | ICD-10-CM | POA: Diagnosis not present

## 2016-11-09 DIAGNOSIS — S5010XA Contusion of unspecified forearm, initial encounter: Secondary | ICD-10-CM | POA: Diagnosis not present

## 2016-11-09 DIAGNOSIS — T796XXA Traumatic ischemia of muscle, initial encounter: Secondary | ICD-10-CM | POA: Diagnosis not present

## 2016-11-09 DIAGNOSIS — R42 Dizziness and giddiness: Secondary | ICD-10-CM | POA: Diagnosis not present

## 2016-11-10 DIAGNOSIS — E119 Type 2 diabetes mellitus without complications: Secondary | ICD-10-CM | POA: Diagnosis not present

## 2016-11-10 DIAGNOSIS — E039 Hypothyroidism, unspecified: Secondary | ICD-10-CM | POA: Diagnosis not present

## 2016-11-10 DIAGNOSIS — S3992XA Unspecified injury of lower back, initial encounter: Secondary | ICD-10-CM | POA: Diagnosis not present

## 2016-11-10 DIAGNOSIS — R42 Dizziness and giddiness: Secondary | ICD-10-CM | POA: Diagnosis not present

## 2016-11-10 DIAGNOSIS — R262 Difficulty in walking, not elsewhere classified: Secondary | ICD-10-CM | POA: Diagnosis not present

## 2016-11-10 DIAGNOSIS — T796XXA Traumatic ischemia of muscle, initial encounter: Secondary | ICD-10-CM | POA: Diagnosis not present

## 2016-11-10 DIAGNOSIS — I2510 Atherosclerotic heart disease of native coronary artery without angina pectoris: Secondary | ICD-10-CM

## 2016-11-10 DIAGNOSIS — W19XXXA Unspecified fall, initial encounter: Secondary | ICD-10-CM | POA: Diagnosis not present

## 2016-11-11 DIAGNOSIS — W19XXXA Unspecified fall, initial encounter: Secondary | ICD-10-CM | POA: Diagnosis not present

## 2016-11-11 DIAGNOSIS — R262 Difficulty in walking, not elsewhere classified: Secondary | ICD-10-CM | POA: Diagnosis not present

## 2016-11-11 DIAGNOSIS — M25522 Pain in left elbow: Secondary | ICD-10-CM | POA: Diagnosis not present

## 2016-11-11 DIAGNOSIS — S59902A Unspecified injury of left elbow, initial encounter: Secondary | ICD-10-CM | POA: Diagnosis not present

## 2016-11-11 DIAGNOSIS — E039 Hypothyroidism, unspecified: Secondary | ICD-10-CM | POA: Diagnosis not present

## 2016-11-11 DIAGNOSIS — T796XXA Traumatic ischemia of muscle, initial encounter: Secondary | ICD-10-CM | POA: Diagnosis not present

## 2016-11-11 DIAGNOSIS — M7989 Other specified soft tissue disorders: Secondary | ICD-10-CM | POA: Diagnosis not present

## 2016-11-11 DIAGNOSIS — R531 Weakness: Secondary | ICD-10-CM | POA: Diagnosis not present

## 2016-11-11 DIAGNOSIS — I2510 Atherosclerotic heart disease of native coronary artery without angina pectoris: Secondary | ICD-10-CM | POA: Diagnosis not present

## 2016-11-11 DIAGNOSIS — E119 Type 2 diabetes mellitus without complications: Secondary | ICD-10-CM | POA: Diagnosis not present

## 2016-11-12 DIAGNOSIS — E039 Hypothyroidism, unspecified: Secondary | ICD-10-CM | POA: Diagnosis not present

## 2016-11-12 DIAGNOSIS — G4700 Insomnia, unspecified: Secondary | ICD-10-CM | POA: Diagnosis not present

## 2016-11-12 DIAGNOSIS — M6282 Rhabdomyolysis: Secondary | ICD-10-CM | POA: Diagnosis not present

## 2016-11-12 DIAGNOSIS — W19XXXA Unspecified fall, initial encounter: Secondary | ICD-10-CM | POA: Diagnosis not present

## 2016-11-12 DIAGNOSIS — E119 Type 2 diabetes mellitus without complications: Secondary | ICD-10-CM | POA: Diagnosis not present

## 2016-11-12 DIAGNOSIS — R197 Diarrhea, unspecified: Secondary | ICD-10-CM | POA: Diagnosis not present

## 2016-11-12 DIAGNOSIS — I2510 Atherosclerotic heart disease of native coronary artery without angina pectoris: Secondary | ICD-10-CM | POA: Diagnosis not present

## 2016-11-12 DIAGNOSIS — R51 Headache: Secondary | ICD-10-CM | POA: Diagnosis not present

## 2016-11-12 DIAGNOSIS — R5381 Other malaise: Secondary | ICD-10-CM | POA: Diagnosis not present

## 2016-11-12 DIAGNOSIS — R42 Dizziness and giddiness: Secondary | ICD-10-CM | POA: Diagnosis not present

## 2016-11-12 DIAGNOSIS — Z79899 Other long term (current) drug therapy: Secondary | ICD-10-CM | POA: Diagnosis not present

## 2016-11-12 DIAGNOSIS — R262 Difficulty in walking, not elsewhere classified: Secondary | ICD-10-CM | POA: Diagnosis not present

## 2016-11-12 DIAGNOSIS — M6281 Muscle weakness (generalized): Secondary | ICD-10-CM | POA: Diagnosis not present

## 2016-11-12 DIAGNOSIS — E118 Type 2 diabetes mellitus with unspecified complications: Secondary | ICD-10-CM | POA: Diagnosis not present

## 2016-11-12 DIAGNOSIS — R29700 NIHSS score 0: Secondary | ICD-10-CM | POA: Diagnosis not present

## 2016-11-12 DIAGNOSIS — T796XXA Traumatic ischemia of muscle, initial encounter: Secondary | ICD-10-CM | POA: Diagnosis not present

## 2016-11-12 DIAGNOSIS — R2232 Localized swelling, mass and lump, left upper limb: Secondary | ICD-10-CM | POA: Diagnosis not present

## 2016-11-27 DIAGNOSIS — S199XXA Unspecified injury of neck, initial encounter: Secondary | ICD-10-CM | POA: Diagnosis not present

## 2016-11-27 DIAGNOSIS — R269 Unspecified abnormalities of gait and mobility: Secondary | ICD-10-CM | POA: Diagnosis not present

## 2016-11-27 DIAGNOSIS — S0990XA Unspecified injury of head, initial encounter: Secondary | ICD-10-CM | POA: Diagnosis not present

## 2016-11-27 DIAGNOSIS — E860 Dehydration: Secondary | ICD-10-CM | POA: Diagnosis not present

## 2016-11-27 DIAGNOSIS — Z9181 History of falling: Secondary | ICD-10-CM | POA: Diagnosis not present

## 2016-11-27 DIAGNOSIS — E119 Type 2 diabetes mellitus without complications: Secondary | ICD-10-CM | POA: Diagnosis not present

## 2016-11-27 DIAGNOSIS — R262 Difficulty in walking, not elsewhere classified: Secondary | ICD-10-CM | POA: Diagnosis not present

## 2016-11-27 DIAGNOSIS — T148XXA Other injury of unspecified body region, initial encounter: Secondary | ICD-10-CM | POA: Diagnosis not present

## 2016-11-27 DIAGNOSIS — S299XXA Unspecified injury of thorax, initial encounter: Secondary | ICD-10-CM | POA: Diagnosis not present

## 2016-11-27 DIAGNOSIS — E870 Hyperosmolality and hypernatremia: Secondary | ICD-10-CM | POA: Diagnosis not present

## 2016-11-27 DIAGNOSIS — M79602 Pain in left arm: Secondary | ICD-10-CM | POA: Diagnosis not present

## 2016-11-27 DIAGNOSIS — Z8673 Personal history of transient ischemic attack (TIA), and cerebral infarction without residual deficits: Secondary | ICD-10-CM | POA: Diagnosis not present

## 2016-11-27 DIAGNOSIS — N289 Disorder of kidney and ureter, unspecified: Secondary | ICD-10-CM | POA: Diagnosis not present

## 2016-11-27 DIAGNOSIS — S3991XA Unspecified injury of abdomen, initial encounter: Secondary | ICD-10-CM | POA: Diagnosis not present

## 2016-11-28 ENCOUNTER — Inpatient Hospital Stay
Admission: AD | Admit: 2016-11-28 | Payer: Self-pay | Source: Other Acute Inpatient Hospital | Admitting: Internal Medicine

## 2016-11-28 DIAGNOSIS — W19XXXA Unspecified fall, initial encounter: Secondary | ICD-10-CM | POA: Diagnosis not present

## 2016-11-28 DIAGNOSIS — T8454XA Infection and inflammatory reaction due to internal left knee prosthesis, initial encounter: Secondary | ICD-10-CM | POA: Diagnosis not present

## 2016-11-28 DIAGNOSIS — I81 Portal vein thrombosis: Secondary | ICD-10-CM | POA: Diagnosis not present

## 2016-11-28 DIAGNOSIS — E874 Mixed disorder of acid-base balance: Secondary | ICD-10-CM | POA: Diagnosis not present

## 2016-11-28 DIAGNOSIS — M25422 Effusion, left elbow: Secondary | ICD-10-CM | POA: Diagnosis not present

## 2016-11-28 DIAGNOSIS — G9340 Encephalopathy, unspecified: Secondary | ICD-10-CM | POA: Diagnosis not present

## 2016-11-28 DIAGNOSIS — J90 Pleural effusion, not elsewhere classified: Secondary | ICD-10-CM | POA: Diagnosis not present

## 2016-11-28 DIAGNOSIS — R6520 Severe sepsis without septic shock: Secondary | ICD-10-CM | POA: Diagnosis not present

## 2016-11-28 DIAGNOSIS — R7989 Other specified abnormal findings of blood chemistry: Secondary | ICD-10-CM | POA: Diagnosis not present

## 2016-11-28 DIAGNOSIS — R509 Fever, unspecified: Secondary | ICD-10-CM | POA: Diagnosis not present

## 2016-11-28 DIAGNOSIS — I110 Hypertensive heart disease with heart failure: Secondary | ICD-10-CM | POA: Diagnosis not present

## 2016-11-28 DIAGNOSIS — E870 Hyperosmolality and hypernatremia: Secondary | ICD-10-CM | POA: Diagnosis not present

## 2016-11-28 DIAGNOSIS — B965 Pseudomonas (aeruginosa) (mallei) (pseudomallei) as the cause of diseases classified elsewhere: Secondary | ICD-10-CM | POA: Diagnosis not present

## 2016-11-28 DIAGNOSIS — Z4682 Encounter for fitting and adjustment of non-vascular catheter: Secondary | ICD-10-CM | POA: Diagnosis not present

## 2016-11-28 DIAGNOSIS — R601 Generalized edema: Secondary | ICD-10-CM | POA: Diagnosis not present

## 2016-11-28 DIAGNOSIS — A4902 Methicillin resistant Staphylococcus aureus infection, unspecified site: Secondary | ICD-10-CM | POA: Diagnosis not present

## 2016-11-28 DIAGNOSIS — T8144XA Sepsis following a procedure, initial encounter: Secondary | ICD-10-CM | POA: Diagnosis not present

## 2016-11-28 DIAGNOSIS — I509 Heart failure, unspecified: Secondary | ICD-10-CM | POA: Diagnosis not present

## 2016-11-28 DIAGNOSIS — I351 Nonrheumatic aortic (valve) insufficiency: Secondary | ICD-10-CM | POA: Diagnosis not present

## 2016-11-28 DIAGNOSIS — T847XXA Infection and inflammatory reaction due to other internal orthopedic prosthetic devices, implants and grafts, initial encounter: Secondary | ICD-10-CM | POA: Diagnosis not present

## 2016-11-28 DIAGNOSIS — R110 Nausea: Secondary | ICD-10-CM | POA: Diagnosis not present

## 2016-11-28 DIAGNOSIS — N390 Urinary tract infection, site not specified: Secondary | ICD-10-CM | POA: Diagnosis not present

## 2016-11-28 DIAGNOSIS — M0009 Staphylococcal polyarthritis: Secondary | ICD-10-CM | POA: Diagnosis not present

## 2016-11-28 DIAGNOSIS — I4510 Unspecified right bundle-branch block: Secondary | ICD-10-CM | POA: Diagnosis not present

## 2016-11-28 DIAGNOSIS — I071 Rheumatic tricuspid insufficiency: Secondary | ICD-10-CM | POA: Diagnosis not present

## 2016-11-28 DIAGNOSIS — M533 Sacrococcygeal disorders, not elsewhere classified: Secondary | ICD-10-CM | POA: Diagnosis not present

## 2016-11-28 DIAGNOSIS — M25562 Pain in left knee: Secondary | ICD-10-CM | POA: Diagnosis not present

## 2016-11-28 DIAGNOSIS — L89159 Pressure ulcer of sacral region, unspecified stage: Secondary | ICD-10-CM | POA: Diagnosis not present

## 2016-11-28 DIAGNOSIS — Z967 Presence of other bone and tendon implants: Secondary | ICD-10-CM | POA: Diagnosis not present

## 2016-11-28 DIAGNOSIS — K6812 Psoas muscle abscess: Secondary | ICD-10-CM | POA: Diagnosis not present

## 2016-11-28 DIAGNOSIS — Z8673 Personal history of transient ischemic attack (TIA), and cerebral infarction without residual deficits: Secondary | ICD-10-CM | POA: Diagnosis not present

## 2016-11-28 DIAGNOSIS — G062 Extradural and subdural abscess, unspecified: Secondary | ICD-10-CM | POA: Diagnosis not present

## 2016-11-28 DIAGNOSIS — L02213 Cutaneous abscess of chest wall: Secondary | ICD-10-CM | POA: Diagnosis not present

## 2016-11-28 DIAGNOSIS — R7881 Bacteremia: Secondary | ICD-10-CM | POA: Diagnosis not present

## 2016-11-28 DIAGNOSIS — Z9181 History of falling: Secondary | ICD-10-CM | POA: Diagnosis not present

## 2016-11-28 DIAGNOSIS — G9589 Other specified diseases of spinal cord: Secondary | ICD-10-CM | POA: Diagnosis not present

## 2016-11-28 DIAGNOSIS — R1110 Vomiting, unspecified: Secondary | ICD-10-CM | POA: Diagnosis not present

## 2016-11-28 DIAGNOSIS — K56609 Unspecified intestinal obstruction, unspecified as to partial versus complete obstruction: Secondary | ICD-10-CM | POA: Diagnosis not present

## 2016-11-28 DIAGNOSIS — I440 Atrioventricular block, first degree: Secondary | ICD-10-CM | POA: Diagnosis not present

## 2016-11-28 DIAGNOSIS — N433 Hydrocele, unspecified: Secondary | ICD-10-CM | POA: Diagnosis not present

## 2016-11-28 DIAGNOSIS — E871 Hypo-osmolality and hyponatremia: Secondary | ICD-10-CM | POA: Diagnosis not present

## 2016-11-28 DIAGNOSIS — K551 Chronic vascular disorders of intestine: Secondary | ICD-10-CM | POA: Diagnosis not present

## 2016-11-28 DIAGNOSIS — D649 Anemia, unspecified: Secondary | ICD-10-CM | POA: Diagnosis not present

## 2016-11-28 DIAGNOSIS — N3000 Acute cystitis without hematuria: Secondary | ICD-10-CM | POA: Diagnosis not present

## 2016-11-28 DIAGNOSIS — R5381 Other malaise: Secondary | ICD-10-CM | POA: Diagnosis not present

## 2016-11-28 DIAGNOSIS — J690 Pneumonitis due to inhalation of food and vomit: Secondary | ICD-10-CM | POA: Diagnosis not present

## 2016-11-28 DIAGNOSIS — M25561 Pain in right knee: Secondary | ICD-10-CM | POA: Diagnosis not present

## 2016-11-28 DIAGNOSIS — Z4889 Encounter for other specified surgical aftercare: Secondary | ICD-10-CM | POA: Diagnosis not present

## 2016-11-28 DIAGNOSIS — N289 Disorder of kidney and ureter, unspecified: Secondary | ICD-10-CM | POA: Diagnosis not present

## 2016-11-28 DIAGNOSIS — R197 Diarrhea, unspecified: Secondary | ICD-10-CM | POA: Diagnosis not present

## 2016-11-28 DIAGNOSIS — I10 Essential (primary) hypertension: Secondary | ICD-10-CM | POA: Diagnosis not present

## 2016-11-28 DIAGNOSIS — R600 Localized edema: Secondary | ICD-10-CM | POA: Diagnosis not present

## 2016-11-28 DIAGNOSIS — M48061 Spinal stenosis, lumbar region without neurogenic claudication: Secondary | ICD-10-CM | POA: Diagnosis not present

## 2016-11-28 DIAGNOSIS — M25522 Pain in left elbow: Secondary | ICD-10-CM | POA: Diagnosis not present

## 2016-11-28 DIAGNOSIS — G9341 Metabolic encephalopathy: Secondary | ICD-10-CM | POA: Diagnosis not present

## 2016-11-28 DIAGNOSIS — R262 Difficulty in walking, not elsewhere classified: Secondary | ICD-10-CM | POA: Diagnosis not present

## 2016-11-28 DIAGNOSIS — M00822 Arthritis due to other bacteria, left elbow: Secondary | ICD-10-CM | POA: Diagnosis not present

## 2016-11-28 DIAGNOSIS — M25421 Effusion, right elbow: Secondary | ICD-10-CM | POA: Diagnosis not present

## 2016-11-28 DIAGNOSIS — I639 Cerebral infarction, unspecified: Secondary | ICD-10-CM | POA: Diagnosis not present

## 2016-11-28 DIAGNOSIS — Z9689 Presence of other specified functional implants: Secondary | ICD-10-CM | POA: Diagnosis not present

## 2016-11-28 DIAGNOSIS — M76892 Other specified enthesopathies of left lower limb, excluding foot: Secondary | ICD-10-CM | POA: Diagnosis not present

## 2016-11-28 DIAGNOSIS — E43 Unspecified severe protein-calorie malnutrition: Secondary | ICD-10-CM | POA: Diagnosis not present

## 2016-11-28 DIAGNOSIS — N179 Acute kidney failure, unspecified: Secondary | ICD-10-CM | POA: Diagnosis not present

## 2016-11-28 DIAGNOSIS — M00862 Arthritis due to other bacteria, left knee: Secondary | ICD-10-CM | POA: Diagnosis not present

## 2016-11-28 DIAGNOSIS — R531 Weakness: Secondary | ICD-10-CM | POA: Diagnosis not present

## 2016-11-28 DIAGNOSIS — Z431 Encounter for attention to gastrostomy: Secondary | ICD-10-CM | POA: Diagnosis not present

## 2016-11-28 DIAGNOSIS — D72829 Elevated white blood cell count, unspecified: Secondary | ICD-10-CM | POA: Diagnosis not present

## 2016-11-28 DIAGNOSIS — M160 Bilateral primary osteoarthritis of hip: Secondary | ICD-10-CM | POA: Diagnosis not present

## 2016-11-28 DIAGNOSIS — R0602 Shortness of breath: Secondary | ICD-10-CM | POA: Diagnosis not present

## 2016-11-28 DIAGNOSIS — R918 Other nonspecific abnormal finding of lung field: Secondary | ICD-10-CM | POA: Diagnosis not present

## 2016-11-28 DIAGNOSIS — E860 Dehydration: Secondary | ICD-10-CM | POA: Diagnosis not present

## 2016-11-28 DIAGNOSIS — E119 Type 2 diabetes mellitus without complications: Secondary | ICD-10-CM | POA: Diagnosis not present

## 2016-11-28 DIAGNOSIS — Z7409 Other reduced mobility: Secondary | ICD-10-CM | POA: Diagnosis not present

## 2016-11-28 DIAGNOSIS — E8809 Other disorders of plasma-protein metabolism, not elsewhere classified: Secondary | ICD-10-CM | POA: Diagnosis not present

## 2016-11-28 DIAGNOSIS — R392 Extrarenal uremia: Secondary | ICD-10-CM | POA: Diagnosis not present

## 2016-11-28 DIAGNOSIS — N170 Acute kidney failure with tubular necrosis: Secondary | ICD-10-CM | POA: Diagnosis not present

## 2016-11-28 DIAGNOSIS — B9562 Methicillin resistant Staphylococcus aureus infection as the cause of diseases classified elsewhere: Secondary | ICD-10-CM | POA: Diagnosis not present

## 2016-11-28 DIAGNOSIS — R140 Abdominal distension (gaseous): Secondary | ICD-10-CM | POA: Diagnosis not present

## 2016-11-28 DIAGNOSIS — M00062 Staphylococcal arthritis, left knee: Secondary | ICD-10-CM | POA: Diagnosis not present

## 2016-11-28 DIAGNOSIS — E872 Acidosis: Secondary | ICD-10-CM | POA: Diagnosis not present

## 2016-11-28 DIAGNOSIS — I2510 Atherosclerotic heart disease of native coronary artery without angina pectoris: Secondary | ICD-10-CM | POA: Diagnosis not present

## 2016-11-28 DIAGNOSIS — E861 Hypovolemia: Secondary | ICD-10-CM | POA: Diagnosis not present

## 2016-11-28 DIAGNOSIS — M009 Pyogenic arthritis, unspecified: Secondary | ICD-10-CM | POA: Diagnosis not present

## 2016-11-28 DIAGNOSIS — I712 Thoracic aortic aneurysm, without rupture: Secondary | ICD-10-CM | POA: Diagnosis not present

## 2016-11-28 DIAGNOSIS — I998 Other disorder of circulatory system: Secondary | ICD-10-CM | POA: Diagnosis not present

## 2016-11-28 DIAGNOSIS — R109 Unspecified abdominal pain: Secondary | ICD-10-CM | POA: Diagnosis not present

## 2016-11-28 DIAGNOSIS — E039 Hypothyroidism, unspecified: Secondary | ICD-10-CM | POA: Diagnosis not present

## 2016-11-28 DIAGNOSIS — K219 Gastro-esophageal reflux disease without esophagitis: Secondary | ICD-10-CM | POA: Diagnosis not present

## 2016-11-28 DIAGNOSIS — M00061 Staphylococcal arthritis, right knee: Secondary | ICD-10-CM | POA: Diagnosis not present

## 2016-11-28 DIAGNOSIS — K922 Gastrointestinal hemorrhage, unspecified: Secondary | ICD-10-CM | POA: Diagnosis not present

## 2016-11-28 DIAGNOSIS — G061 Intraspinal abscess and granuloma: Secondary | ICD-10-CM | POA: Diagnosis not present

## 2016-11-28 DIAGNOSIS — M222X2 Patellofemoral disorders, left knee: Secondary | ICD-10-CM | POA: Diagnosis not present

## 2016-11-28 DIAGNOSIS — M25521 Pain in right elbow: Secondary | ICD-10-CM | POA: Diagnosis not present

## 2016-11-28 DIAGNOSIS — A4102 Sepsis due to Methicillin resistant Staphylococcus aureus: Secondary | ICD-10-CM | POA: Diagnosis not present

## 2016-11-28 DIAGNOSIS — M4802 Spinal stenosis, cervical region: Secondary | ICD-10-CM | POA: Diagnosis not present

## 2016-11-28 DIAGNOSIS — A419 Sepsis, unspecified organism: Secondary | ICD-10-CM | POA: Diagnosis not present

## 2016-11-28 DIAGNOSIS — D72819 Decreased white blood cell count, unspecified: Secondary | ICD-10-CM | POA: Diagnosis not present

## 2016-11-28 DIAGNOSIS — Z96652 Presence of left artificial knee joint: Secondary | ICD-10-CM | POA: Diagnosis not present

## 2016-11-28 DIAGNOSIS — M00861 Arthritis due to other bacteria, right knee: Secondary | ICD-10-CM | POA: Diagnosis not present

## 2016-11-28 DIAGNOSIS — M2342 Loose body in knee, left knee: Secondary | ICD-10-CM | POA: Diagnosis not present

## 2016-11-28 DIAGNOSIS — M00022 Staphylococcal arthritis, left elbow: Secondary | ICD-10-CM | POA: Diagnosis not present

## 2016-11-28 DIAGNOSIS — J9600 Acute respiratory failure, unspecified whether with hypoxia or hypercapnia: Secondary | ICD-10-CM | POA: Diagnosis not present

## 2016-11-29 DIAGNOSIS — I2510 Atherosclerotic heart disease of native coronary artery without angina pectoris: Secondary | ICD-10-CM | POA: Insufficient documentation

## 2016-11-29 DIAGNOSIS — I10 Essential (primary) hypertension: Secondary | ICD-10-CM

## 2016-11-29 DIAGNOSIS — N179 Acute kidney failure, unspecified: Secondary | ICD-10-CM

## 2016-11-29 DIAGNOSIS — I110 Hypertensive heart disease with heart failure: Secondary | ICD-10-CM

## 2016-11-29 DIAGNOSIS — E669 Obesity, unspecified: Secondary | ICD-10-CM

## 2016-11-29 DIAGNOSIS — I639 Cerebral infarction, unspecified: Secondary | ICD-10-CM | POA: Insufficient documentation

## 2016-11-29 HISTORY — DX: Hypertensive heart disease with heart failure: I11.0

## 2016-11-29 HISTORY — DX: Atherosclerotic heart disease of native coronary artery without angina pectoris: I25.10

## 2016-11-29 HISTORY — DX: Obesity, unspecified: E66.9

## 2016-11-29 HISTORY — DX: Essential (primary) hypertension: I10

## 2016-11-29 HISTORY — DX: Acute kidney failure, unspecified: N17.9

## 2016-11-29 HISTORY — DX: Cerebral infarction, unspecified: I63.9

## 2016-12-15 DIAGNOSIS — I440 Atrioventricular block, first degree: Secondary | ICD-10-CM | POA: Diagnosis not present

## 2016-12-21 DIAGNOSIS — F32A Depression, unspecified: Secondary | ICD-10-CM

## 2016-12-21 DIAGNOSIS — R5381 Other malaise: Secondary | ICD-10-CM

## 2016-12-21 DIAGNOSIS — N433 Hydrocele, unspecified: Secondary | ICD-10-CM

## 2016-12-21 DIAGNOSIS — E119 Type 2 diabetes mellitus without complications: Secondary | ICD-10-CM

## 2016-12-21 DIAGNOSIS — E43 Unspecified severe protein-calorie malnutrition: Secondary | ICD-10-CM

## 2016-12-21 DIAGNOSIS — G9341 Metabolic encephalopathy: Secondary | ICD-10-CM

## 2016-12-21 DIAGNOSIS — R601 Generalized edema: Secondary | ICD-10-CM

## 2016-12-21 DIAGNOSIS — I77810 Thoracic aortic ectasia: Secondary | ICD-10-CM

## 2016-12-21 DIAGNOSIS — R197 Diarrhea, unspecified: Secondary | ICD-10-CM | POA: Insufficient documentation

## 2016-12-21 DIAGNOSIS — D649 Anemia, unspecified: Secondary | ICD-10-CM

## 2016-12-21 DIAGNOSIS — K56609 Unspecified intestinal obstruction, unspecified as to partial versus complete obstruction: Secondary | ICD-10-CM

## 2016-12-21 DIAGNOSIS — F329 Major depressive disorder, single episode, unspecified: Secondary | ICD-10-CM | POA: Insufficient documentation

## 2016-12-21 HISTORY — DX: Other malaise: R53.81

## 2016-12-21 HISTORY — DX: Type 2 diabetes mellitus without complications: E11.9

## 2016-12-21 HISTORY — DX: Depression, unspecified: F32.A

## 2016-12-21 HISTORY — DX: Unspecified severe protein-calorie malnutrition: E43

## 2016-12-21 HISTORY — DX: Anemia, unspecified: D64.9

## 2016-12-21 HISTORY — DX: Hydrocele, unspecified: N43.3

## 2016-12-21 HISTORY — DX: Diarrhea, unspecified: R19.7

## 2016-12-21 HISTORY — DX: Generalized edema: R60.1

## 2016-12-21 HISTORY — DX: Thoracic aortic ectasia: I77.810

## 2016-12-21 HISTORY — DX: Unspecified intestinal obstruction, unspecified as to partial versus complete obstruction: K56.609

## 2016-12-21 HISTORY — DX: Metabolic encephalopathy: G93.41

## 2016-12-26 DIAGNOSIS — IMO0002 Reserved for concepts with insufficient information to code with codable children: Secondary | ICD-10-CM

## 2016-12-26 DIAGNOSIS — K55069 Acute infarction of intestine, part and extent unspecified: Secondary | ICD-10-CM

## 2016-12-26 HISTORY — DX: Acute infarction of intestine, part and extent unspecified: K55.069

## 2017-01-02 DIAGNOSIS — L89154 Pressure ulcer of sacral region, stage 4: Secondary | ICD-10-CM | POA: Insufficient documentation

## 2017-01-02 HISTORY — DX: Pressure ulcer of sacral region, stage 4: L89.154

## 2017-01-03 DIAGNOSIS — L89153 Pressure ulcer of sacral region, stage 3: Secondary | ICD-10-CM | POA: Diagnosis not present

## 2017-01-03 DIAGNOSIS — I509 Heart failure, unspecified: Secondary | ICD-10-CM | POA: Diagnosis not present

## 2017-01-03 DIAGNOSIS — M00022 Staphylococcal arthritis, left elbow: Secondary | ICD-10-CM | POA: Diagnosis not present

## 2017-01-03 DIAGNOSIS — G9341 Metabolic encephalopathy: Secondary | ICD-10-CM | POA: Diagnosis not present

## 2017-01-03 DIAGNOSIS — R197 Diarrhea, unspecified: Secondary | ICD-10-CM | POA: Diagnosis not present

## 2017-01-03 DIAGNOSIS — A419 Sepsis, unspecified organism: Secondary | ICD-10-CM | POA: Diagnosis not present

## 2017-01-03 DIAGNOSIS — Z4801 Encounter for change or removal of surgical wound dressing: Secondary | ICD-10-CM | POA: Diagnosis not present

## 2017-01-03 DIAGNOSIS — S31000A Unspecified open wound of lower back and pelvis without penetration into retroperitoneum, initial encounter: Secondary | ICD-10-CM | POA: Diagnosis not present

## 2017-01-03 DIAGNOSIS — I719 Aortic aneurysm of unspecified site, without rupture: Secondary | ICD-10-CM | POA: Diagnosis not present

## 2017-01-03 DIAGNOSIS — D649 Anemia, unspecified: Secondary | ICD-10-CM | POA: Diagnosis not present

## 2017-01-03 DIAGNOSIS — I639 Cerebral infarction, unspecified: Secondary | ICD-10-CM | POA: Diagnosis not present

## 2017-01-03 DIAGNOSIS — K551 Chronic vascular disorders of intestine: Secondary | ICD-10-CM | POA: Diagnosis not present

## 2017-01-03 DIAGNOSIS — A4902 Methicillin resistant Staphylococcus aureus infection, unspecified site: Secondary | ICD-10-CM | POA: Diagnosis not present

## 2017-01-03 DIAGNOSIS — N3000 Acute cystitis without hematuria: Secondary | ICD-10-CM | POA: Diagnosis not present

## 2017-01-03 DIAGNOSIS — J690 Pneumonitis due to inhalation of food and vomit: Secondary | ICD-10-CM | POA: Diagnosis not present

## 2017-01-03 DIAGNOSIS — M00862 Arthritis due to other bacteria, left knee: Secondary | ICD-10-CM | POA: Diagnosis not present

## 2017-01-03 DIAGNOSIS — Z7902 Long term (current) use of antithrombotics/antiplatelets: Secondary | ICD-10-CM | POA: Diagnosis not present

## 2017-01-03 DIAGNOSIS — I5032 Chronic diastolic (congestive) heart failure: Secondary | ICD-10-CM | POA: Diagnosis not present

## 2017-01-03 DIAGNOSIS — N433 Hydrocele, unspecified: Secondary | ICD-10-CM | POA: Diagnosis not present

## 2017-01-03 DIAGNOSIS — Z7409 Other reduced mobility: Secondary | ICD-10-CM | POA: Diagnosis not present

## 2017-01-03 DIAGNOSIS — E1169 Type 2 diabetes mellitus with other specified complication: Secondary | ICD-10-CM | POA: Diagnosis not present

## 2017-01-03 DIAGNOSIS — I10 Essential (primary) hypertension: Secondary | ICD-10-CM | POA: Diagnosis not present

## 2017-01-03 DIAGNOSIS — M00861 Arthritis due to other bacteria, right knee: Secondary | ICD-10-CM | POA: Diagnosis not present

## 2017-01-03 DIAGNOSIS — Z79899 Other long term (current) drug therapy: Secondary | ICD-10-CM | POA: Diagnosis not present

## 2017-01-03 DIAGNOSIS — M00061 Staphylococcal arthritis, right knee: Secondary | ICD-10-CM | POA: Diagnosis not present

## 2017-01-03 DIAGNOSIS — R531 Weakness: Secondary | ICD-10-CM | POA: Diagnosis not present

## 2017-01-03 DIAGNOSIS — D519 Vitamin B12 deficiency anemia, unspecified: Secondary | ICD-10-CM | POA: Diagnosis not present

## 2017-01-03 DIAGNOSIS — Z9889 Other specified postprocedural states: Secondary | ICD-10-CM | POA: Diagnosis not present

## 2017-01-03 DIAGNOSIS — D62 Acute posthemorrhagic anemia: Secondary | ICD-10-CM | POA: Diagnosis not present

## 2017-01-03 DIAGNOSIS — B961 Klebsiella pneumoniae [K. pneumoniae] as the cause of diseases classified elsewhere: Secondary | ICD-10-CM | POA: Diagnosis not present

## 2017-01-03 DIAGNOSIS — E43 Unspecified severe protein-calorie malnutrition: Secondary | ICD-10-CM | POA: Diagnosis not present

## 2017-01-03 DIAGNOSIS — I2510 Atherosclerotic heart disease of native coronary artery without angina pectoris: Secondary | ICD-10-CM | POA: Diagnosis not present

## 2017-01-03 DIAGNOSIS — N179 Acute kidney failure, unspecified: Secondary | ICD-10-CM | POA: Diagnosis not present

## 2017-01-03 DIAGNOSIS — R5381 Other malaise: Secondary | ICD-10-CM | POA: Diagnosis not present

## 2017-01-03 DIAGNOSIS — Z8673 Personal history of transient ischemic attack (TIA), and cerebral infarction without residual deficits: Secondary | ICD-10-CM | POA: Diagnosis not present

## 2017-01-03 DIAGNOSIS — E559 Vitamin D deficiency, unspecified: Secondary | ICD-10-CM | POA: Diagnosis not present

## 2017-01-03 DIAGNOSIS — K56609 Unspecified intestinal obstruction, unspecified as to partial versus complete obstruction: Secondary | ICD-10-CM | POA: Diagnosis not present

## 2017-01-03 DIAGNOSIS — R601 Generalized edema: Secondary | ICD-10-CM | POA: Diagnosis not present

## 2017-01-03 DIAGNOSIS — Z4789 Encounter for other orthopedic aftercare: Secondary | ICD-10-CM | POA: Diagnosis not present

## 2017-01-03 DIAGNOSIS — G061 Intraspinal abscess and granuloma: Secondary | ICD-10-CM | POA: Diagnosis not present

## 2017-01-03 DIAGNOSIS — G4709 Other insomnia: Secondary | ICD-10-CM | POA: Diagnosis not present

## 2017-01-03 DIAGNOSIS — L0291 Cutaneous abscess, unspecified: Secondary | ICD-10-CM | POA: Diagnosis not present

## 2017-01-03 DIAGNOSIS — K219 Gastro-esophageal reflux disease without esophagitis: Secondary | ICD-10-CM | POA: Diagnosis not present

## 2017-01-03 DIAGNOSIS — S34139A Unspecified injury to sacral spinal cord, initial encounter: Secondary | ICD-10-CM | POA: Diagnosis not present

## 2017-01-03 DIAGNOSIS — I712 Thoracic aortic aneurysm, without rupture: Secondary | ICD-10-CM | POA: Diagnosis not present

## 2017-01-03 DIAGNOSIS — E119 Type 2 diabetes mellitus without complications: Secondary | ICD-10-CM | POA: Diagnosis not present

## 2017-01-03 DIAGNOSIS — Z792 Long term (current) use of antibiotics: Secondary | ICD-10-CM | POA: Diagnosis not present

## 2017-01-03 DIAGNOSIS — B964 Proteus (mirabilis) (morganii) as the cause of diseases classified elsewhere: Secondary | ICD-10-CM | POA: Diagnosis not present

## 2017-01-03 DIAGNOSIS — M00822 Arthritis due to other bacteria, left elbow: Secondary | ICD-10-CM | POA: Diagnosis not present

## 2017-01-03 DIAGNOSIS — R7881 Bacteremia: Secondary | ICD-10-CM | POA: Diagnosis not present

## 2017-01-03 DIAGNOSIS — G0490 Encephalitis and encephalomyelitis, unspecified: Secondary | ICD-10-CM | POA: Diagnosis not present

## 2017-01-03 DIAGNOSIS — R262 Difficulty in walking, not elsewhere classified: Secondary | ICD-10-CM | POA: Diagnosis not present

## 2017-01-03 DIAGNOSIS — M009 Pyogenic arthritis, unspecified: Secondary | ICD-10-CM | POA: Diagnosis not present

## 2017-01-03 DIAGNOSIS — B9562 Methicillin resistant Staphylococcus aureus infection as the cause of diseases classified elsewhere: Secondary | ICD-10-CM | POA: Diagnosis not present

## 2017-01-03 DIAGNOSIS — E7800 Pure hypercholesterolemia, unspecified: Secondary | ICD-10-CM | POA: Diagnosis not present

## 2017-01-03 DIAGNOSIS — M00062 Staphylococcal arthritis, left knee: Secondary | ICD-10-CM | POA: Diagnosis not present

## 2017-01-03 DIAGNOSIS — L0231 Cutaneous abscess of buttock: Secondary | ICD-10-CM | POA: Diagnosis not present

## 2017-01-03 DIAGNOSIS — M2550 Pain in unspecified joint: Secondary | ICD-10-CM | POA: Diagnosis not present

## 2017-01-03 DIAGNOSIS — E039 Hypothyroidism, unspecified: Secondary | ICD-10-CM | POA: Diagnosis not present

## 2017-01-03 DIAGNOSIS — I110 Hypertensive heart disease with heart failure: Secondary | ICD-10-CM | POA: Diagnosis not present

## 2017-01-03 DIAGNOSIS — L89159 Pressure ulcer of sacral region, unspecified stage: Secondary | ICD-10-CM | POA: Diagnosis not present

## 2017-01-03 DIAGNOSIS — M4628 Osteomyelitis of vertebra, sacral and sacrococcygeal region: Secondary | ICD-10-CM | POA: Diagnosis not present

## 2017-01-13 DIAGNOSIS — R262 Difficulty in walking, not elsewhere classified: Secondary | ICD-10-CM | POA: Diagnosis not present

## 2017-01-23 DIAGNOSIS — B9562 Methicillin resistant Staphylococcus aureus infection as the cause of diseases classified elsewhere: Secondary | ICD-10-CM | POA: Diagnosis not present

## 2017-01-23 DIAGNOSIS — I110 Hypertensive heart disease with heart failure: Secondary | ICD-10-CM | POA: Diagnosis not present

## 2017-01-23 DIAGNOSIS — M00822 Arthritis due to other bacteria, left elbow: Secondary | ICD-10-CM | POA: Diagnosis not present

## 2017-01-23 DIAGNOSIS — E039 Hypothyroidism, unspecified: Secondary | ICD-10-CM | POA: Diagnosis not present

## 2017-01-23 DIAGNOSIS — M00022 Staphylococcal arthritis, left elbow: Secondary | ICD-10-CM | POA: Diagnosis not present

## 2017-01-23 DIAGNOSIS — I509 Heart failure, unspecified: Secondary | ICD-10-CM | POA: Diagnosis not present

## 2017-01-23 DIAGNOSIS — M00862 Arthritis due to other bacteria, left knee: Secondary | ICD-10-CM | POA: Diagnosis not present

## 2017-01-23 DIAGNOSIS — M00062 Staphylococcal arthritis, left knee: Secondary | ICD-10-CM | POA: Diagnosis not present

## 2017-01-23 DIAGNOSIS — I639 Cerebral infarction, unspecified: Secondary | ICD-10-CM | POA: Diagnosis not present

## 2017-01-23 DIAGNOSIS — M00861 Arthritis due to other bacteria, right knee: Secondary | ICD-10-CM | POA: Diagnosis not present

## 2017-01-23 DIAGNOSIS — G0490 Encephalitis and encephalomyelitis, unspecified: Secondary | ICD-10-CM | POA: Diagnosis not present

## 2017-01-23 DIAGNOSIS — L0291 Cutaneous abscess, unspecified: Secondary | ICD-10-CM | POA: Diagnosis not present

## 2017-01-23 DIAGNOSIS — R7881 Bacteremia: Secondary | ICD-10-CM | POA: Diagnosis not present

## 2017-01-23 DIAGNOSIS — Z792 Long term (current) use of antibiotics: Secondary | ICD-10-CM | POA: Diagnosis not present

## 2017-01-23 DIAGNOSIS — M00061 Staphylococcal arthritis, right knee: Secondary | ICD-10-CM | POA: Diagnosis not present

## 2017-01-30 DIAGNOSIS — Z4789 Encounter for other orthopedic aftercare: Secondary | ICD-10-CM | POA: Diagnosis not present

## 2017-01-30 DIAGNOSIS — Z9889 Other specified postprocedural states: Secondary | ICD-10-CM | POA: Diagnosis not present

## 2017-01-30 DIAGNOSIS — G4709 Other insomnia: Secondary | ICD-10-CM | POA: Diagnosis not present

## 2017-01-30 DIAGNOSIS — M009 Pyogenic arthritis, unspecified: Secondary | ICD-10-CM | POA: Diagnosis not present

## 2017-02-03 DIAGNOSIS — R601 Generalized edema: Secondary | ICD-10-CM | POA: Diagnosis not present

## 2017-02-03 DIAGNOSIS — M009 Pyogenic arthritis, unspecified: Secondary | ICD-10-CM | POA: Diagnosis not present

## 2017-02-03 DIAGNOSIS — K551 Chronic vascular disorders of intestine: Secondary | ICD-10-CM | POA: Diagnosis not present

## 2017-02-03 DIAGNOSIS — M2550 Pain in unspecified joint: Secondary | ICD-10-CM | POA: Diagnosis not present

## 2017-02-03 DIAGNOSIS — E039 Hypothyroidism, unspecified: Secondary | ICD-10-CM | POA: Diagnosis not present

## 2017-02-03 DIAGNOSIS — I5032 Chronic diastolic (congestive) heart failure: Secondary | ICD-10-CM | POA: Diagnosis not present

## 2017-02-03 DIAGNOSIS — I639 Cerebral infarction, unspecified: Secondary | ICD-10-CM | POA: Diagnosis not present

## 2017-02-03 DIAGNOSIS — D649 Anemia, unspecified: Secondary | ICD-10-CM | POA: Diagnosis not present

## 2017-02-03 DIAGNOSIS — B961 Klebsiella pneumoniae [K. pneumoniae] as the cause of diseases classified elsewhere: Secondary | ICD-10-CM | POA: Diagnosis not present

## 2017-02-03 DIAGNOSIS — E1169 Type 2 diabetes mellitus with other specified complication: Secondary | ICD-10-CM | POA: Diagnosis not present

## 2017-02-03 DIAGNOSIS — L89159 Pressure ulcer of sacral region, unspecified stage: Secondary | ICD-10-CM | POA: Diagnosis not present

## 2017-02-03 DIAGNOSIS — M00061 Staphylococcal arthritis, right knee: Secondary | ICD-10-CM | POA: Diagnosis not present

## 2017-02-03 DIAGNOSIS — I110 Hypertensive heart disease with heart failure: Secondary | ICD-10-CM | POA: Diagnosis not present

## 2017-02-03 DIAGNOSIS — Z4801 Encounter for change or removal of surgical wound dressing: Secondary | ICD-10-CM | POA: Diagnosis not present

## 2017-02-03 DIAGNOSIS — I509 Heart failure, unspecified: Secondary | ICD-10-CM | POA: Diagnosis not present

## 2017-02-03 DIAGNOSIS — L0291 Cutaneous abscess, unspecified: Secondary | ICD-10-CM | POA: Diagnosis not present

## 2017-02-03 DIAGNOSIS — I712 Thoracic aortic aneurysm, without rupture: Secondary | ICD-10-CM | POA: Diagnosis not present

## 2017-02-03 DIAGNOSIS — D62 Acute posthemorrhagic anemia: Secondary | ICD-10-CM | POA: Diagnosis not present

## 2017-02-03 DIAGNOSIS — D519 Vitamin B12 deficiency anemia, unspecified: Secondary | ICD-10-CM | POA: Diagnosis not present

## 2017-02-03 DIAGNOSIS — G061 Intraspinal abscess and granuloma: Secondary | ICD-10-CM | POA: Diagnosis not present

## 2017-02-03 DIAGNOSIS — Z79899 Other long term (current) drug therapy: Secondary | ICD-10-CM | POA: Diagnosis not present

## 2017-02-03 DIAGNOSIS — M4628 Osteomyelitis of vertebra, sacral and sacrococcygeal region: Secondary | ICD-10-CM | POA: Diagnosis not present

## 2017-02-03 DIAGNOSIS — I2510 Atherosclerotic heart disease of native coronary artery without angina pectoris: Secondary | ICD-10-CM | POA: Diagnosis not present

## 2017-02-03 DIAGNOSIS — S34139A Unspecified injury to sacral spinal cord, initial encounter: Secondary | ICD-10-CM | POA: Diagnosis not present

## 2017-02-03 DIAGNOSIS — M00022 Staphylococcal arthritis, left elbow: Secondary | ICD-10-CM | POA: Diagnosis not present

## 2017-02-03 DIAGNOSIS — L02212 Cutaneous abscess of back [any part, except buttock]: Secondary | ICD-10-CM | POA: Diagnosis not present

## 2017-02-03 DIAGNOSIS — E43 Unspecified severe protein-calorie malnutrition: Secondary | ICD-10-CM | POA: Diagnosis not present

## 2017-02-03 DIAGNOSIS — N433 Hydrocele, unspecified: Secondary | ICD-10-CM | POA: Diagnosis not present

## 2017-02-03 DIAGNOSIS — M00822 Arthritis due to other bacteria, left elbow: Secondary | ICD-10-CM | POA: Diagnosis not present

## 2017-02-03 DIAGNOSIS — A419 Sepsis, unspecified organism: Secondary | ICD-10-CM | POA: Diagnosis not present

## 2017-02-03 DIAGNOSIS — B964 Proteus (mirabilis) (morganii) as the cause of diseases classified elsewhere: Secondary | ICD-10-CM | POA: Diagnosis not present

## 2017-02-03 DIAGNOSIS — M00861 Arthritis due to other bacteria, right knee: Secondary | ICD-10-CM | POA: Diagnosis not present

## 2017-02-03 DIAGNOSIS — I10 Essential (primary) hypertension: Secondary | ICD-10-CM | POA: Diagnosis not present

## 2017-02-03 DIAGNOSIS — G9341 Metabolic encephalopathy: Secondary | ICD-10-CM | POA: Diagnosis not present

## 2017-02-03 DIAGNOSIS — E119 Type 2 diabetes mellitus without complications: Secondary | ICD-10-CM | POA: Diagnosis not present

## 2017-02-03 DIAGNOSIS — R7881 Bacteremia: Secondary | ICD-10-CM | POA: Diagnosis not present

## 2017-02-03 DIAGNOSIS — N3000 Acute cystitis without hematuria: Secondary | ICD-10-CM | POA: Diagnosis not present

## 2017-02-03 DIAGNOSIS — K219 Gastro-esophageal reflux disease without esophagitis: Secondary | ICD-10-CM | POA: Diagnosis not present

## 2017-02-03 DIAGNOSIS — L89153 Pressure ulcer of sacral region, stage 3: Secondary | ICD-10-CM | POA: Diagnosis not present

## 2017-02-03 DIAGNOSIS — G0490 Encephalitis and encephalomyelitis, unspecified: Secondary | ICD-10-CM | POA: Diagnosis not present

## 2017-02-03 DIAGNOSIS — E11622 Type 2 diabetes mellitus with other skin ulcer: Secondary | ICD-10-CM | POA: Diagnosis not present

## 2017-02-03 DIAGNOSIS — Z967 Presence of other bone and tendon implants: Secondary | ICD-10-CM | POA: Diagnosis not present

## 2017-02-03 DIAGNOSIS — L0231 Cutaneous abscess of buttock: Secondary | ICD-10-CM | POA: Diagnosis not present

## 2017-02-03 DIAGNOSIS — B9562 Methicillin resistant Staphylococcus aureus infection as the cause of diseases classified elsewhere: Secondary | ICD-10-CM | POA: Diagnosis not present

## 2017-02-03 DIAGNOSIS — J690 Pneumonitis due to inhalation of food and vomit: Secondary | ICD-10-CM | POA: Diagnosis not present

## 2017-02-03 DIAGNOSIS — Z743 Need for continuous supervision: Secondary | ICD-10-CM | POA: Diagnosis not present

## 2017-02-03 DIAGNOSIS — Z8673 Personal history of transient ischemic attack (TIA), and cerebral infarction without residual deficits: Secondary | ICD-10-CM | POA: Diagnosis not present

## 2017-02-03 DIAGNOSIS — A4902 Methicillin resistant Staphylococcus aureus infection, unspecified site: Secondary | ICD-10-CM | POA: Diagnosis not present

## 2017-02-03 DIAGNOSIS — M00062 Staphylococcal arthritis, left knee: Secondary | ICD-10-CM | POA: Diagnosis not present

## 2017-02-03 DIAGNOSIS — Z7902 Long term (current) use of antithrombotics/antiplatelets: Secondary | ICD-10-CM | POA: Diagnosis not present

## 2017-02-03 DIAGNOSIS — S31000A Unspecified open wound of lower back and pelvis without penetration into retroperitoneum, initial encounter: Secondary | ICD-10-CM | POA: Diagnosis not present

## 2017-02-03 DIAGNOSIS — R5381 Other malaise: Secondary | ICD-10-CM | POA: Diagnosis not present

## 2017-02-03 DIAGNOSIS — E559 Vitamin D deficiency, unspecified: Secondary | ICD-10-CM | POA: Diagnosis not present

## 2017-02-03 DIAGNOSIS — R197 Diarrhea, unspecified: Secondary | ICD-10-CM | POA: Diagnosis not present

## 2017-02-03 DIAGNOSIS — K56609 Unspecified intestinal obstruction, unspecified as to partial versus complete obstruction: Secondary | ICD-10-CM | POA: Diagnosis not present

## 2017-02-03 DIAGNOSIS — I719 Aortic aneurysm of unspecified site, without rupture: Secondary | ICD-10-CM | POA: Diagnosis not present

## 2017-02-03 DIAGNOSIS — R279 Unspecified lack of coordination: Secondary | ICD-10-CM | POA: Diagnosis not present

## 2017-02-03 DIAGNOSIS — N179 Acute kidney failure, unspecified: Secondary | ICD-10-CM | POA: Diagnosis not present

## 2017-02-04 DIAGNOSIS — I5032 Chronic diastolic (congestive) heart failure: Secondary | ICD-10-CM

## 2017-02-04 DIAGNOSIS — K219 Gastro-esophageal reflux disease without esophagitis: Secondary | ICD-10-CM | POA: Insufficient documentation

## 2017-02-04 HISTORY — DX: Chronic diastolic (congestive) heart failure: I50.32

## 2017-02-04 HISTORY — DX: Gastro-esophageal reflux disease without esophagitis: K21.9

## 2017-02-05 HISTORY — DX: Hypomagnesemia: E83.42

## 2017-02-06 DIAGNOSIS — D62 Acute posthemorrhagic anemia: Secondary | ICD-10-CM | POA: Insufficient documentation

## 2017-02-06 HISTORY — DX: Acute posthemorrhagic anemia: D62

## 2017-02-08 DIAGNOSIS — R5381 Other malaise: Secondary | ICD-10-CM

## 2017-02-08 HISTORY — DX: Other malaise: R53.81

## 2017-02-11 DIAGNOSIS — I719 Aortic aneurysm of unspecified site, without rupture: Secondary | ICD-10-CM | POA: Insufficient documentation

## 2017-02-11 HISTORY — DX: Aortic aneurysm of unspecified site, without rupture: I71.9

## 2017-02-14 DIAGNOSIS — R197 Diarrhea, unspecified: Secondary | ICD-10-CM | POA: Diagnosis not present

## 2017-02-14 DIAGNOSIS — I10 Essential (primary) hypertension: Secondary | ICD-10-CM | POA: Diagnosis not present

## 2017-02-14 DIAGNOSIS — I5032 Chronic diastolic (congestive) heart failure: Secondary | ICD-10-CM | POA: Diagnosis not present

## 2017-02-14 DIAGNOSIS — M00861 Arthritis due to other bacteria, right knee: Secondary | ICD-10-CM | POA: Diagnosis not present

## 2017-02-14 DIAGNOSIS — G4700 Insomnia, unspecified: Secondary | ICD-10-CM | POA: Diagnosis not present

## 2017-02-14 DIAGNOSIS — L89154 Pressure ulcer of sacral region, stage 4: Secondary | ICD-10-CM | POA: Diagnosis not present

## 2017-02-14 DIAGNOSIS — I712 Thoracic aortic aneurysm, without rupture: Secondary | ICD-10-CM | POA: Diagnosis not present

## 2017-02-14 DIAGNOSIS — K551 Chronic vascular disorders of intestine: Secondary | ICD-10-CM | POA: Diagnosis not present

## 2017-02-14 DIAGNOSIS — B952 Enterococcus as the cause of diseases classified elsewhere: Secondary | ICD-10-CM | POA: Diagnosis not present

## 2017-02-14 DIAGNOSIS — M00061 Staphylococcal arthritis, right knee: Secondary | ICD-10-CM | POA: Diagnosis not present

## 2017-02-14 DIAGNOSIS — A419 Sepsis, unspecified organism: Secondary | ICD-10-CM | POA: Diagnosis not present

## 2017-02-14 DIAGNOSIS — M00022 Staphylococcal arthritis, left elbow: Secondary | ICD-10-CM | POA: Diagnosis not present

## 2017-02-14 DIAGNOSIS — K219 Gastro-esophageal reflux disease without esophagitis: Secondary | ICD-10-CM | POA: Diagnosis not present

## 2017-02-14 DIAGNOSIS — G9341 Metabolic encephalopathy: Secondary | ICD-10-CM | POA: Diagnosis not present

## 2017-02-14 DIAGNOSIS — B9562 Methicillin resistant Staphylococcus aureus infection as the cause of diseases classified elsewhere: Secondary | ICD-10-CM | POA: Diagnosis not present

## 2017-02-14 DIAGNOSIS — L89153 Pressure ulcer of sacral region, stage 3: Secondary | ICD-10-CM | POA: Diagnosis not present

## 2017-02-14 DIAGNOSIS — I2510 Atherosclerotic heart disease of native coronary artery without angina pectoris: Secondary | ICD-10-CM | POA: Diagnosis not present

## 2017-02-14 DIAGNOSIS — R102 Pelvic and perineal pain: Secondary | ICD-10-CM | POA: Diagnosis not present

## 2017-02-14 DIAGNOSIS — R300 Dysuria: Secondary | ICD-10-CM | POA: Diagnosis not present

## 2017-02-14 DIAGNOSIS — M00822 Arthritis due to other bacteria, left elbow: Secondary | ICD-10-CM | POA: Diagnosis not present

## 2017-02-14 DIAGNOSIS — Z8673 Personal history of transient ischemic attack (TIA), and cerebral infarction without residual deficits: Secondary | ICD-10-CM | POA: Diagnosis not present

## 2017-02-14 DIAGNOSIS — L89620 Pressure ulcer of left heel, unstageable: Secondary | ICD-10-CM | POA: Diagnosis not present

## 2017-02-14 DIAGNOSIS — Z7902 Long term (current) use of antithrombotics/antiplatelets: Secondary | ICD-10-CM | POA: Diagnosis not present

## 2017-02-14 DIAGNOSIS — R279 Unspecified lack of coordination: Secondary | ICD-10-CM | POA: Diagnosis not present

## 2017-02-14 DIAGNOSIS — L89109 Pressure ulcer of unspecified part of back, unspecified stage: Secondary | ICD-10-CM | POA: Diagnosis not present

## 2017-02-14 DIAGNOSIS — Z955 Presence of coronary angioplasty implant and graft: Secondary | ICD-10-CM | POA: Diagnosis not present

## 2017-02-14 DIAGNOSIS — J690 Pneumonitis due to inhalation of food and vomit: Secondary | ICD-10-CM | POA: Diagnosis not present

## 2017-02-14 DIAGNOSIS — I110 Hypertensive heart disease with heart failure: Secondary | ICD-10-CM | POA: Diagnosis not present

## 2017-02-14 DIAGNOSIS — B961 Klebsiella pneumoniae [K. pneumoniae] as the cause of diseases classified elsewhere: Secondary | ICD-10-CM | POA: Diagnosis not present

## 2017-02-14 DIAGNOSIS — E119 Type 2 diabetes mellitus without complications: Secondary | ICD-10-CM | POA: Diagnosis not present

## 2017-02-14 DIAGNOSIS — K56609 Unspecified intestinal obstruction, unspecified as to partial versus complete obstruction: Secondary | ICD-10-CM | POA: Diagnosis not present

## 2017-02-14 DIAGNOSIS — B965 Pseudomonas (aeruginosa) (mallei) (pseudomallei) as the cause of diseases classified elsewhere: Secondary | ICD-10-CM | POA: Diagnosis not present

## 2017-02-14 DIAGNOSIS — N433 Hydrocele, unspecified: Secondary | ICD-10-CM | POA: Diagnosis not present

## 2017-02-14 DIAGNOSIS — B964 Proteus (mirabilis) (morganii) as the cause of diseases classified elsewhere: Secondary | ICD-10-CM | POA: Diagnosis not present

## 2017-02-14 DIAGNOSIS — Z96659 Presence of unspecified artificial knee joint: Secondary | ICD-10-CM | POA: Diagnosis not present

## 2017-02-14 DIAGNOSIS — I351 Nonrheumatic aortic (valve) insufficiency: Secondary | ICD-10-CM | POA: Diagnosis not present

## 2017-02-14 DIAGNOSIS — Z794 Long term (current) use of insulin: Secondary | ICD-10-CM | POA: Diagnosis not present

## 2017-02-14 DIAGNOSIS — R7881 Bacteremia: Secondary | ICD-10-CM | POA: Diagnosis not present

## 2017-02-14 DIAGNOSIS — D509 Iron deficiency anemia, unspecified: Secondary | ICD-10-CM | POA: Diagnosis not present

## 2017-02-14 DIAGNOSIS — M00062 Staphylococcal arthritis, left knee: Secondary | ICD-10-CM | POA: Diagnosis not present

## 2017-02-14 DIAGNOSIS — E441 Mild protein-calorie malnutrition: Secondary | ICD-10-CM | POA: Diagnosis not present

## 2017-02-14 DIAGNOSIS — L89623 Pressure ulcer of left heel, stage 3: Secondary | ICD-10-CM | POA: Diagnosis not present

## 2017-02-14 DIAGNOSIS — Z743 Need for continuous supervision: Secondary | ICD-10-CM | POA: Diagnosis not present

## 2017-02-14 DIAGNOSIS — R5381 Other malaise: Secondary | ICD-10-CM | POA: Diagnosis not present

## 2017-02-14 DIAGNOSIS — I440 Atrioventricular block, first degree: Secondary | ICD-10-CM | POA: Diagnosis not present

## 2017-02-14 DIAGNOSIS — L89159 Pressure ulcer of sacral region, unspecified stage: Secondary | ICD-10-CM | POA: Diagnosis not present

## 2017-02-14 DIAGNOSIS — E43 Unspecified severe protein-calorie malnutrition: Secondary | ICD-10-CM | POA: Diagnosis not present

## 2017-02-14 DIAGNOSIS — N3000 Acute cystitis without hematuria: Secondary | ICD-10-CM | POA: Diagnosis not present

## 2017-02-14 DIAGNOSIS — Z79899 Other long term (current) drug therapy: Secondary | ICD-10-CM | POA: Diagnosis not present

## 2017-02-14 DIAGNOSIS — I639 Cerebral infarction, unspecified: Secondary | ICD-10-CM | POA: Diagnosis not present

## 2017-02-14 DIAGNOSIS — L8992 Pressure ulcer of unspecified site, stage 2: Secondary | ICD-10-CM | POA: Diagnosis not present

## 2017-02-14 DIAGNOSIS — N179 Acute kidney failure, unspecified: Secondary | ICD-10-CM | POA: Diagnosis not present

## 2017-02-14 DIAGNOSIS — Z8614 Personal history of Methicillin resistant Staphylococcus aureus infection: Secondary | ICD-10-CM | POA: Diagnosis not present

## 2017-02-14 DIAGNOSIS — Z792 Long term (current) use of antibiotics: Secondary | ICD-10-CM | POA: Diagnosis not present

## 2017-02-14 DIAGNOSIS — Z1623 Resistance to quinolones and fluoroquinolones: Secondary | ICD-10-CM | POA: Diagnosis not present

## 2017-02-14 DIAGNOSIS — A4902 Methicillin resistant Staphylococcus aureus infection, unspecified site: Secondary | ICD-10-CM | POA: Diagnosis not present

## 2017-02-14 DIAGNOSIS — I4510 Unspecified right bundle-branch block: Secondary | ICD-10-CM | POA: Diagnosis not present

## 2017-02-14 DIAGNOSIS — R601 Generalized edema: Secondary | ICD-10-CM | POA: Diagnosis not present

## 2017-02-14 DIAGNOSIS — E039 Hypothyroidism, unspecified: Secondary | ICD-10-CM | POA: Diagnosis not present

## 2017-02-14 DIAGNOSIS — I719 Aortic aneurysm of unspecified site, without rupture: Secondary | ICD-10-CM | POA: Diagnosis not present

## 2017-02-14 DIAGNOSIS — L0291 Cutaneous abscess, unspecified: Secondary | ICD-10-CM | POA: Diagnosis not present

## 2017-02-14 DIAGNOSIS — D649 Anemia, unspecified: Secondary | ICD-10-CM | POA: Diagnosis not present

## 2017-02-19 DIAGNOSIS — L89154 Pressure ulcer of sacral region, stage 4: Secondary | ICD-10-CM | POA: Diagnosis not present

## 2017-02-24 DIAGNOSIS — L89154 Pressure ulcer of sacral region, stage 4: Secondary | ICD-10-CM | POA: Diagnosis not present

## 2017-02-24 DIAGNOSIS — Z792 Long term (current) use of antibiotics: Secondary | ICD-10-CM | POA: Diagnosis not present

## 2017-02-24 DIAGNOSIS — A4902 Methicillin resistant Staphylococcus aureus infection, unspecified site: Secondary | ICD-10-CM | POA: Diagnosis not present

## 2017-02-24 DIAGNOSIS — B964 Proteus (mirabilis) (morganii) as the cause of diseases classified elsewhere: Secondary | ICD-10-CM | POA: Diagnosis not present

## 2017-02-24 DIAGNOSIS — R7881 Bacteremia: Secondary | ICD-10-CM | POA: Diagnosis not present

## 2017-02-24 DIAGNOSIS — L89109 Pressure ulcer of unspecified part of back, unspecified stage: Secondary | ICD-10-CM | POA: Diagnosis not present

## 2017-02-24 DIAGNOSIS — L89159 Pressure ulcer of sacral region, unspecified stage: Secondary | ICD-10-CM | POA: Diagnosis not present

## 2017-02-24 DIAGNOSIS — L89153 Pressure ulcer of sacral region, stage 3: Secondary | ICD-10-CM | POA: Diagnosis not present

## 2017-02-24 DIAGNOSIS — G9341 Metabolic encephalopathy: Secondary | ICD-10-CM | POA: Diagnosis not present

## 2017-02-24 DIAGNOSIS — L89623 Pressure ulcer of left heel, stage 3: Secondary | ICD-10-CM | POA: Diagnosis not present

## 2017-02-24 DIAGNOSIS — R5381 Other malaise: Secondary | ICD-10-CM | POA: Diagnosis not present

## 2017-02-24 DIAGNOSIS — B961 Klebsiella pneumoniae [K. pneumoniae] as the cause of diseases classified elsewhere: Secondary | ICD-10-CM | POA: Diagnosis not present

## 2017-02-24 DIAGNOSIS — B952 Enterococcus as the cause of diseases classified elsewhere: Secondary | ICD-10-CM | POA: Diagnosis not present

## 2017-03-06 DIAGNOSIS — I5030 Unspecified diastolic (congestive) heart failure: Secondary | ICD-10-CM | POA: Diagnosis not present

## 2017-03-06 DIAGNOSIS — Z8614 Personal history of Methicillin resistant Staphylococcus aureus infection: Secondary | ICD-10-CM | POA: Diagnosis not present

## 2017-03-06 DIAGNOSIS — N433 Hydrocele, unspecified: Secondary | ICD-10-CM | POA: Diagnosis not present

## 2017-03-06 DIAGNOSIS — L89620 Pressure ulcer of left heel, unstageable: Secondary | ICD-10-CM | POA: Diagnosis not present

## 2017-03-06 DIAGNOSIS — E039 Hypothyroidism, unspecified: Secondary | ICD-10-CM | POA: Diagnosis not present

## 2017-03-06 DIAGNOSIS — R279 Unspecified lack of coordination: Secondary | ICD-10-CM | POA: Diagnosis not present

## 2017-03-06 DIAGNOSIS — L0291 Cutaneous abscess, unspecified: Secondary | ICD-10-CM | POA: Diagnosis not present

## 2017-03-06 DIAGNOSIS — Z794 Long term (current) use of insulin: Secondary | ICD-10-CM | POA: Diagnosis not present

## 2017-03-06 DIAGNOSIS — I2510 Atherosclerotic heart disease of native coronary artery without angina pectoris: Secondary | ICD-10-CM | POA: Diagnosis not present

## 2017-03-06 DIAGNOSIS — I110 Hypertensive heart disease with heart failure: Secondary | ICD-10-CM | POA: Diagnosis not present

## 2017-03-06 DIAGNOSIS — E43 Unspecified severe protein-calorie malnutrition: Secondary | ICD-10-CM | POA: Diagnosis not present

## 2017-03-06 DIAGNOSIS — Z955 Presence of coronary angioplasty implant and graft: Secondary | ICD-10-CM | POA: Diagnosis not present

## 2017-03-06 DIAGNOSIS — L89154 Pressure ulcer of sacral region, stage 4: Secondary | ICD-10-CM | POA: Diagnosis not present

## 2017-03-06 DIAGNOSIS — I440 Atrioventricular block, first degree: Secondary | ICD-10-CM | POA: Diagnosis not present

## 2017-03-06 DIAGNOSIS — L97429 Non-pressure chronic ulcer of left heel and midfoot with unspecified severity: Secondary | ICD-10-CM | POA: Diagnosis not present

## 2017-03-06 DIAGNOSIS — I509 Heart failure, unspecified: Secondary | ICD-10-CM | POA: Diagnosis not present

## 2017-03-06 DIAGNOSIS — G4700 Insomnia, unspecified: Secondary | ICD-10-CM | POA: Diagnosis not present

## 2017-03-06 DIAGNOSIS — Z743 Need for continuous supervision: Secondary | ICD-10-CM | POA: Diagnosis not present

## 2017-03-06 DIAGNOSIS — I639 Cerebral infarction, unspecified: Secondary | ICD-10-CM | POA: Diagnosis not present

## 2017-03-06 DIAGNOSIS — R300 Dysuria: Secondary | ICD-10-CM | POA: Diagnosis not present

## 2017-03-06 DIAGNOSIS — B9561 Methicillin susceptible Staphylococcus aureus infection as the cause of diseases classified elsewhere: Secondary | ICD-10-CM | POA: Diagnosis not present

## 2017-03-06 DIAGNOSIS — K56609 Unspecified intestinal obstruction, unspecified as to partial versus complete obstruction: Secondary | ICD-10-CM | POA: Diagnosis not present

## 2017-03-06 DIAGNOSIS — E119 Type 2 diabetes mellitus without complications: Secondary | ICD-10-CM | POA: Diagnosis not present

## 2017-03-06 DIAGNOSIS — D649 Anemia, unspecified: Secondary | ICD-10-CM | POA: Diagnosis not present

## 2017-03-06 DIAGNOSIS — N368 Other specified disorders of urethra: Secondary | ICD-10-CM | POA: Diagnosis not present

## 2017-03-06 DIAGNOSIS — T8450XD Infection and inflammatory reaction due to unspecified internal joint prosthesis, subsequent encounter: Secondary | ICD-10-CM | POA: Diagnosis not present

## 2017-03-06 DIAGNOSIS — Z8619 Personal history of other infectious and parasitic diseases: Secondary | ICD-10-CM | POA: Diagnosis not present

## 2017-03-06 DIAGNOSIS — I351 Nonrheumatic aortic (valve) insufficiency: Secondary | ICD-10-CM | POA: Diagnosis not present

## 2017-03-06 DIAGNOSIS — Z7902 Long term (current) use of antithrombotics/antiplatelets: Secondary | ICD-10-CM | POA: Diagnosis not present

## 2017-03-06 DIAGNOSIS — B961 Klebsiella pneumoniae [K. pneumoniae] as the cause of diseases classified elsewhere: Secondary | ICD-10-CM | POA: Diagnosis not present

## 2017-03-06 DIAGNOSIS — K219 Gastro-esophageal reflux disease without esophagitis: Secondary | ICD-10-CM | POA: Diagnosis not present

## 2017-03-06 DIAGNOSIS — Z96659 Presence of unspecified artificial knee joint: Secondary | ICD-10-CM | POA: Diagnosis not present

## 2017-03-06 DIAGNOSIS — B356 Tinea cruris: Secondary | ICD-10-CM | POA: Diagnosis not present

## 2017-03-06 DIAGNOSIS — M62838 Other muscle spasm: Secondary | ICD-10-CM | POA: Diagnosis not present

## 2017-03-06 DIAGNOSIS — B965 Pseudomonas (aeruginosa) (mallei) (pseudomallei) as the cause of diseases classified elsewhere: Secondary | ICD-10-CM | POA: Diagnosis not present

## 2017-03-06 DIAGNOSIS — I4891 Unspecified atrial fibrillation: Secondary | ICD-10-CM | POA: Diagnosis not present

## 2017-03-06 DIAGNOSIS — I719 Aortic aneurysm of unspecified site, without rupture: Secondary | ICD-10-CM | POA: Diagnosis not present

## 2017-03-06 DIAGNOSIS — R601 Generalized edema: Secondary | ICD-10-CM | POA: Diagnosis not present

## 2017-03-06 DIAGNOSIS — L02416 Cutaneous abscess of left lower limb: Secondary | ICD-10-CM | POA: Diagnosis not present

## 2017-03-06 DIAGNOSIS — I77810 Thoracic aortic ectasia: Secondary | ICD-10-CM | POA: Diagnosis not present

## 2017-03-06 DIAGNOSIS — L89159 Pressure ulcer of sacral region, unspecified stage: Secondary | ICD-10-CM | POA: Diagnosis not present

## 2017-03-06 DIAGNOSIS — L8992 Pressure ulcer of unspecified site, stage 2: Secondary | ICD-10-CM | POA: Diagnosis not present

## 2017-03-06 DIAGNOSIS — I5032 Chronic diastolic (congestive) heart failure: Secondary | ICD-10-CM | POA: Diagnosis not present

## 2017-03-06 DIAGNOSIS — I25810 Atherosclerosis of coronary artery bypass graft(s) without angina pectoris: Secondary | ICD-10-CM | POA: Diagnosis not present

## 2017-03-06 DIAGNOSIS — Z1623 Resistance to quinolones and fluoroquinolones: Secondary | ICD-10-CM | POA: Diagnosis not present

## 2017-03-06 DIAGNOSIS — L89153 Pressure ulcer of sacral region, stage 3: Secondary | ICD-10-CM | POA: Diagnosis not present

## 2017-03-06 DIAGNOSIS — I10 Essential (primary) hypertension: Secondary | ICD-10-CM | POA: Diagnosis not present

## 2017-03-06 DIAGNOSIS — I4510 Unspecified right bundle-branch block: Secondary | ICD-10-CM | POA: Diagnosis not present

## 2017-03-06 DIAGNOSIS — E441 Mild protein-calorie malnutrition: Secondary | ICD-10-CM | POA: Diagnosis not present

## 2017-03-06 DIAGNOSIS — G9341 Metabolic encephalopathy: Secondary | ICD-10-CM | POA: Diagnosis not present

## 2017-03-06 DIAGNOSIS — Z8673 Personal history of transient ischemic attack (TIA), and cerebral infarction without residual deficits: Secondary | ICD-10-CM | POA: Diagnosis not present

## 2017-03-06 DIAGNOSIS — D509 Iron deficiency anemia, unspecified: Secondary | ICD-10-CM | POA: Diagnosis not present

## 2017-03-06 DIAGNOSIS — R5381 Other malaise: Secondary | ICD-10-CM | POA: Diagnosis not present

## 2017-03-06 DIAGNOSIS — R2689 Other abnormalities of gait and mobility: Secondary | ICD-10-CM | POA: Diagnosis not present

## 2017-03-06 DIAGNOSIS — M6281 Muscle weakness (generalized): Secondary | ICD-10-CM | POA: Diagnosis not present

## 2017-03-06 DIAGNOSIS — R102 Pelvic and perineal pain: Secondary | ICD-10-CM | POA: Diagnosis not present

## 2017-03-06 DIAGNOSIS — I81 Portal vein thrombosis: Secondary | ICD-10-CM | POA: Diagnosis not present

## 2017-03-07 DIAGNOSIS — T8361XA Infection and inflammatory reaction due to implanted penile prosthesis, initial encounter: Secondary | ICD-10-CM

## 2017-03-07 HISTORY — DX: Infection and inflammatory reaction due to implanted penile prosthesis, initial encounter: T83.61XA

## 2017-03-13 DIAGNOSIS — T8450XD Infection and inflammatory reaction due to unspecified internal joint prosthesis, subsequent encounter: Secondary | ICD-10-CM | POA: Diagnosis not present

## 2017-03-13 DIAGNOSIS — L89154 Pressure ulcer of sacral region, stage 4: Secondary | ICD-10-CM | POA: Diagnosis not present

## 2017-03-13 DIAGNOSIS — L89153 Pressure ulcer of sacral region, stage 3: Secondary | ICD-10-CM | POA: Diagnosis not present

## 2017-03-13 DIAGNOSIS — R601 Generalized edema: Secondary | ICD-10-CM | POA: Diagnosis not present

## 2017-03-13 DIAGNOSIS — G9341 Metabolic encephalopathy: Secondary | ICD-10-CM | POA: Diagnosis not present

## 2017-03-13 DIAGNOSIS — I77810 Thoracic aortic ectasia: Secondary | ICD-10-CM | POA: Diagnosis not present

## 2017-03-13 DIAGNOSIS — R2689 Other abnormalities of gait and mobility: Secondary | ICD-10-CM | POA: Diagnosis not present

## 2017-03-13 DIAGNOSIS — I351 Nonrheumatic aortic (valve) insufficiency: Secondary | ICD-10-CM | POA: Insufficient documentation

## 2017-03-13 DIAGNOSIS — K219 Gastro-esophageal reflux disease without esophagitis: Secondary | ICD-10-CM | POA: Diagnosis not present

## 2017-03-13 DIAGNOSIS — L89629 Pressure ulcer of left heel, unspecified stage: Secondary | ICD-10-CM | POA: Diagnosis not present

## 2017-03-13 DIAGNOSIS — M62838 Other muscle spasm: Secondary | ICD-10-CM | POA: Diagnosis not present

## 2017-03-13 DIAGNOSIS — L89620 Pressure ulcer of left heel, unstageable: Secondary | ICD-10-CM | POA: Insufficient documentation

## 2017-03-13 DIAGNOSIS — I2510 Atherosclerotic heart disease of native coronary artery without angina pectoris: Secondary | ICD-10-CM | POA: Diagnosis not present

## 2017-03-13 DIAGNOSIS — I110 Hypertensive heart disease with heart failure: Secondary | ICD-10-CM | POA: Diagnosis not present

## 2017-03-13 DIAGNOSIS — R5381 Other malaise: Secondary | ICD-10-CM | POA: Diagnosis not present

## 2017-03-13 DIAGNOSIS — Z96659 Presence of unspecified artificial knee joint: Secondary | ICD-10-CM | POA: Diagnosis not present

## 2017-03-13 DIAGNOSIS — I639 Cerebral infarction, unspecified: Secondary | ICD-10-CM | POA: Diagnosis not present

## 2017-03-13 DIAGNOSIS — B9561 Methicillin susceptible Staphylococcus aureus infection as the cause of diseases classified elsewhere: Secondary | ICD-10-CM | POA: Diagnosis not present

## 2017-03-13 DIAGNOSIS — T8361XD Infection and inflammatory reaction due to implanted penile prosthesis, subsequent encounter: Secondary | ICD-10-CM | POA: Insufficient documentation

## 2017-03-13 DIAGNOSIS — L8992 Pressure ulcer of unspecified site, stage 2: Secondary | ICD-10-CM | POA: Diagnosis not present

## 2017-03-13 DIAGNOSIS — E43 Unspecified severe protein-calorie malnutrition: Secondary | ICD-10-CM | POA: Diagnosis not present

## 2017-03-13 DIAGNOSIS — Z9359 Other cystostomy status: Secondary | ICD-10-CM | POA: Diagnosis not present

## 2017-03-13 DIAGNOSIS — I82591 Chronic embolism and thrombosis of other specified deep vein of right lower extremity: Secondary | ICD-10-CM | POA: Diagnosis not present

## 2017-03-13 DIAGNOSIS — I82441 Acute embolism and thrombosis of right tibial vein: Secondary | ICD-10-CM | POA: Diagnosis not present

## 2017-03-13 DIAGNOSIS — Z9114 Patient's other noncompliance with medication regimen: Secondary | ICD-10-CM | POA: Diagnosis not present

## 2017-03-13 DIAGNOSIS — I82431 Acute embolism and thrombosis of right popliteal vein: Secondary | ICD-10-CM | POA: Diagnosis not present

## 2017-03-13 DIAGNOSIS — S31809A Unspecified open wound of unspecified buttock, initial encounter: Secondary | ICD-10-CM | POA: Diagnosis not present

## 2017-03-13 DIAGNOSIS — Z466 Encounter for fitting and adjustment of urinary device: Secondary | ICD-10-CM | POA: Diagnosis not present

## 2017-03-13 DIAGNOSIS — D509 Iron deficiency anemia, unspecified: Secondary | ICD-10-CM | POA: Diagnosis not present

## 2017-03-13 DIAGNOSIS — Z7401 Bed confinement status: Secondary | ICD-10-CM | POA: Diagnosis not present

## 2017-03-13 DIAGNOSIS — I5032 Chronic diastolic (congestive) heart failure: Secondary | ICD-10-CM | POA: Diagnosis not present

## 2017-03-13 DIAGNOSIS — I82421 Acute embolism and thrombosis of right iliac vein: Secondary | ICD-10-CM | POA: Diagnosis not present

## 2017-03-13 DIAGNOSIS — Z743 Need for continuous supervision: Secondary | ICD-10-CM | POA: Diagnosis not present

## 2017-03-13 DIAGNOSIS — M6281 Muscle weakness (generalized): Secondary | ICD-10-CM | POA: Diagnosis not present

## 2017-03-13 DIAGNOSIS — I81 Portal vein thrombosis: Secondary | ICD-10-CM | POA: Diagnosis not present

## 2017-03-13 DIAGNOSIS — I4891 Unspecified atrial fibrillation: Secondary | ICD-10-CM | POA: Diagnosis not present

## 2017-03-13 DIAGNOSIS — D649 Anemia, unspecified: Secondary | ICD-10-CM | POA: Diagnosis not present

## 2017-03-13 DIAGNOSIS — I440 Atrioventricular block, first degree: Secondary | ICD-10-CM | POA: Insufficient documentation

## 2017-03-13 DIAGNOSIS — L89622 Pressure ulcer of left heel, stage 2: Secondary | ICD-10-CM | POA: Diagnosis not present

## 2017-03-13 DIAGNOSIS — K56609 Unspecified intestinal obstruction, unspecified as to partial versus complete obstruction: Secondary | ICD-10-CM | POA: Diagnosis not present

## 2017-03-13 DIAGNOSIS — R279 Unspecified lack of coordination: Secondary | ICD-10-CM | POA: Diagnosis not present

## 2017-03-13 DIAGNOSIS — G8918 Other acute postprocedural pain: Secondary | ICD-10-CM | POA: Diagnosis not present

## 2017-03-13 DIAGNOSIS — I824Z1 Acute embolism and thrombosis of unspecified deep veins of right distal lower extremity: Secondary | ICD-10-CM | POA: Diagnosis not present

## 2017-03-13 DIAGNOSIS — I82403 Acute embolism and thrombosis of unspecified deep veins of lower extremity, bilateral: Secondary | ICD-10-CM | POA: Diagnosis not present

## 2017-03-13 DIAGNOSIS — I82411 Acute embolism and thrombosis of right femoral vein: Secondary | ICD-10-CM | POA: Diagnosis not present

## 2017-03-13 DIAGNOSIS — I4510 Unspecified right bundle-branch block: Secondary | ICD-10-CM | POA: Insufficient documentation

## 2017-03-13 DIAGNOSIS — N3289 Other specified disorders of bladder: Secondary | ICD-10-CM | POA: Diagnosis not present

## 2017-03-13 DIAGNOSIS — I719 Aortic aneurysm of unspecified site, without rupture: Secondary | ICD-10-CM | POA: Diagnosis not present

## 2017-03-13 DIAGNOSIS — M549 Dorsalgia, unspecified: Secondary | ICD-10-CM | POA: Diagnosis not present

## 2017-03-13 DIAGNOSIS — E119 Type 2 diabetes mellitus without complications: Secondary | ICD-10-CM | POA: Diagnosis not present

## 2017-03-13 DIAGNOSIS — Z960 Presence of urogenital implants: Secondary | ICD-10-CM | POA: Diagnosis not present

## 2017-03-13 DIAGNOSIS — I744 Embolism and thrombosis of arteries of extremities, unspecified: Secondary | ICD-10-CM | POA: Diagnosis not present

## 2017-03-13 DIAGNOSIS — N433 Hydrocele, unspecified: Secondary | ICD-10-CM | POA: Diagnosis not present

## 2017-03-13 DIAGNOSIS — T8459XA Infection and inflammatory reaction due to other internal joint prosthesis, initial encounter: Secondary | ICD-10-CM | POA: Diagnosis not present

## 2017-03-13 DIAGNOSIS — I10 Essential (primary) hypertension: Secondary | ICD-10-CM | POA: Diagnosis not present

## 2017-03-13 DIAGNOSIS — I25810 Atherosclerosis of coronary artery bypass graft(s) without angina pectoris: Secondary | ICD-10-CM | POA: Diagnosis not present

## 2017-03-13 DIAGNOSIS — N179 Acute kidney failure, unspecified: Secondary | ICD-10-CM | POA: Diagnosis not present

## 2017-03-13 DIAGNOSIS — R102 Pelvic and perineal pain: Secondary | ICD-10-CM | POA: Diagnosis not present

## 2017-03-13 DIAGNOSIS — E039 Hypothyroidism, unspecified: Secondary | ICD-10-CM | POA: Insufficient documentation

## 2017-03-13 DIAGNOSIS — I824Z2 Acute embolism and thrombosis of unspecified deep veins of left distal lower extremity: Secondary | ICD-10-CM | POA: Diagnosis not present

## 2017-03-14 DIAGNOSIS — L89620 Pressure ulcer of left heel, unstageable: Secondary | ICD-10-CM | POA: Diagnosis not present

## 2017-03-14 DIAGNOSIS — L89154 Pressure ulcer of sacral region, stage 4: Secondary | ICD-10-CM | POA: Diagnosis not present

## 2017-03-14 DIAGNOSIS — N3289 Other specified disorders of bladder: Secondary | ICD-10-CM | POA: Diagnosis not present

## 2017-03-17 DIAGNOSIS — I5032 Chronic diastolic (congestive) heart failure: Secondary | ICD-10-CM | POA: Diagnosis not present

## 2017-03-17 DIAGNOSIS — L89154 Pressure ulcer of sacral region, stage 4: Secondary | ICD-10-CM | POA: Diagnosis not present

## 2017-03-17 DIAGNOSIS — I110 Hypertensive heart disease with heart failure: Secondary | ICD-10-CM | POA: Diagnosis not present

## 2017-03-19 DIAGNOSIS — N3289 Other specified disorders of bladder: Secondary | ICD-10-CM | POA: Diagnosis not present

## 2017-03-19 DIAGNOSIS — Z9359 Other cystostomy status: Secondary | ICD-10-CM | POA: Diagnosis not present

## 2017-03-19 DIAGNOSIS — Z960 Presence of urogenital implants: Secondary | ICD-10-CM | POA: Diagnosis not present

## 2017-03-20 DIAGNOSIS — Z7401 Bed confinement status: Secondary | ICD-10-CM | POA: Diagnosis not present

## 2017-03-20 DIAGNOSIS — I77810 Thoracic aortic ectasia: Secondary | ICD-10-CM | POA: Diagnosis not present

## 2017-03-20 DIAGNOSIS — I719 Aortic aneurysm of unspecified site, without rupture: Secondary | ICD-10-CM | POA: Diagnosis not present

## 2017-03-24 DIAGNOSIS — G8918 Other acute postprocedural pain: Secondary | ICD-10-CM | POA: Diagnosis not present

## 2017-03-25 DIAGNOSIS — G9341 Metabolic encephalopathy: Secondary | ICD-10-CM | POA: Diagnosis not present

## 2017-03-25 DIAGNOSIS — L89153 Pressure ulcer of sacral region, stage 3: Secondary | ICD-10-CM | POA: Diagnosis not present

## 2017-03-25 DIAGNOSIS — L89622 Pressure ulcer of left heel, stage 2: Secondary | ICD-10-CM | POA: Diagnosis not present

## 2017-03-25 DIAGNOSIS — R5381 Other malaise: Secondary | ICD-10-CM | POA: Diagnosis not present

## 2017-03-26 DIAGNOSIS — I82409 Acute embolism and thrombosis of unspecified deep veins of unspecified lower extremity: Secondary | ICD-10-CM | POA: Insufficient documentation

## 2017-03-26 HISTORY — DX: Acute embolism and thrombosis of unspecified deep veins of unspecified lower extremity: I82.409

## 2017-04-02 DIAGNOSIS — I82441 Acute embolism and thrombosis of right tibial vein: Secondary | ICD-10-CM | POA: Diagnosis not present

## 2017-04-02 DIAGNOSIS — I82421 Acute embolism and thrombosis of right iliac vein: Secondary | ICD-10-CM | POA: Diagnosis not present

## 2017-04-02 DIAGNOSIS — I824Z2 Acute embolism and thrombosis of unspecified deep veins of left distal lower extremity: Secondary | ICD-10-CM | POA: Diagnosis not present

## 2017-04-02 DIAGNOSIS — I82591 Chronic embolism and thrombosis of other specified deep vein of right lower extremity: Secondary | ICD-10-CM | POA: Diagnosis not present

## 2017-04-02 DIAGNOSIS — I82411 Acute embolism and thrombosis of right femoral vein: Secondary | ICD-10-CM | POA: Diagnosis not present

## 2017-04-02 DIAGNOSIS — I824Z1 Acute embolism and thrombosis of unspecified deep veins of right distal lower extremity: Secondary | ICD-10-CM | POA: Diagnosis not present

## 2017-04-02 DIAGNOSIS — I82431 Acute embolism and thrombosis of right popliteal vein: Secondary | ICD-10-CM | POA: Diagnosis not present

## 2017-04-07 DIAGNOSIS — L89153 Pressure ulcer of sacral region, stage 3: Secondary | ICD-10-CM | POA: Diagnosis not present

## 2017-04-07 DIAGNOSIS — L89622 Pressure ulcer of left heel, stage 2: Secondary | ICD-10-CM | POA: Diagnosis not present

## 2017-04-07 DIAGNOSIS — R5381 Other malaise: Secondary | ICD-10-CM | POA: Diagnosis not present

## 2017-04-07 DIAGNOSIS — G9341 Metabolic encephalopathy: Secondary | ICD-10-CM | POA: Diagnosis not present

## 2017-04-08 DIAGNOSIS — G8918 Other acute postprocedural pain: Secondary | ICD-10-CM | POA: Diagnosis not present

## 2017-04-08 DIAGNOSIS — I82403 Acute embolism and thrombosis of unspecified deep veins of lower extremity, bilateral: Secondary | ICD-10-CM | POA: Diagnosis not present

## 2017-04-11 DIAGNOSIS — D509 Iron deficiency anemia, unspecified: Secondary | ICD-10-CM | POA: Diagnosis not present

## 2017-04-11 DIAGNOSIS — Z9114 Patient's other noncompliance with medication regimen: Secondary | ICD-10-CM | POA: Diagnosis not present

## 2017-04-14 DIAGNOSIS — L89153 Pressure ulcer of sacral region, stage 3: Secondary | ICD-10-CM | POA: Diagnosis not present

## 2017-04-14 DIAGNOSIS — R5381 Other malaise: Secondary | ICD-10-CM | POA: Diagnosis not present

## 2017-04-14 DIAGNOSIS — G9341 Metabolic encephalopathy: Secondary | ICD-10-CM | POA: Diagnosis not present

## 2017-04-21 DIAGNOSIS — T8459XA Infection and inflammatory reaction due to other internal joint prosthesis, initial encounter: Secondary | ICD-10-CM | POA: Diagnosis not present

## 2017-04-21 DIAGNOSIS — I2510 Atherosclerotic heart disease of native coronary artery without angina pectoris: Secondary | ICD-10-CM | POA: Diagnosis not present

## 2017-04-21 DIAGNOSIS — Z96659 Presence of unspecified artificial knee joint: Secondary | ICD-10-CM | POA: Diagnosis not present

## 2017-04-21 DIAGNOSIS — G9341 Metabolic encephalopathy: Secondary | ICD-10-CM | POA: Diagnosis not present

## 2017-04-21 DIAGNOSIS — R5381 Other malaise: Secondary | ICD-10-CM | POA: Diagnosis not present

## 2017-04-21 DIAGNOSIS — L89153 Pressure ulcer of sacral region, stage 3: Secondary | ICD-10-CM | POA: Diagnosis not present

## 2017-04-24 DIAGNOSIS — L89154 Pressure ulcer of sacral region, stage 4: Secondary | ICD-10-CM | POA: Diagnosis not present

## 2017-04-24 DIAGNOSIS — N179 Acute kidney failure, unspecified: Secondary | ICD-10-CM | POA: Diagnosis not present

## 2017-04-24 DIAGNOSIS — I639 Cerebral infarction, unspecified: Secondary | ICD-10-CM | POA: Diagnosis not present

## 2017-04-24 DIAGNOSIS — Z466 Encounter for fitting and adjustment of urinary device: Secondary | ICD-10-CM | POA: Diagnosis not present

## 2017-04-28 DIAGNOSIS — L89629 Pressure ulcer of left heel, unspecified stage: Secondary | ICD-10-CM | POA: Diagnosis not present

## 2017-04-28 DIAGNOSIS — L89153 Pressure ulcer of sacral region, stage 3: Secondary | ICD-10-CM | POA: Diagnosis not present

## 2017-04-28 DIAGNOSIS — R5381 Other malaise: Secondary | ICD-10-CM | POA: Diagnosis not present

## 2017-04-28 DIAGNOSIS — G9341 Metabolic encephalopathy: Secondary | ICD-10-CM | POA: Diagnosis not present

## 2017-04-30 DIAGNOSIS — M6281 Muscle weakness (generalized): Secondary | ICD-10-CM | POA: Insufficient documentation

## 2017-04-30 DIAGNOSIS — M62838 Other muscle spasm: Secondary | ICD-10-CM | POA: Insufficient documentation

## 2017-05-01 DIAGNOSIS — T8189XA Other complications of procedures, not elsewhere classified, initial encounter: Secondary | ICD-10-CM | POA: Diagnosis not present

## 2017-05-03 DIAGNOSIS — I10 Essential (primary) hypertension: Secondary | ICD-10-CM | POA: Diagnosis not present

## 2017-05-03 DIAGNOSIS — R404 Transient alteration of awareness: Secondary | ICD-10-CM | POA: Diagnosis not present

## 2017-05-03 DIAGNOSIS — R509 Fever, unspecified: Secondary | ICD-10-CM | POA: Diagnosis not present

## 2017-05-03 DIAGNOSIS — I252 Old myocardial infarction: Secondary | ICD-10-CM | POA: Diagnosis not present

## 2017-05-03 DIAGNOSIS — E119 Type 2 diabetes mellitus without complications: Secondary | ICD-10-CM | POA: Diagnosis not present

## 2017-05-03 DIAGNOSIS — R262 Difficulty in walking, not elsewhere classified: Secondary | ICD-10-CM | POA: Diagnosis not present

## 2017-05-03 DIAGNOSIS — R269 Unspecified abnormalities of gait and mobility: Secondary | ICD-10-CM | POA: Diagnosis not present

## 2017-05-03 DIAGNOSIS — N3090 Cystitis, unspecified without hematuria: Secondary | ICD-10-CM | POA: Diagnosis not present

## 2017-05-03 DIAGNOSIS — N390 Urinary tract infection, site not specified: Secondary | ICD-10-CM | POA: Diagnosis not present

## 2017-05-03 DIAGNOSIS — R531 Weakness: Secondary | ICD-10-CM | POA: Diagnosis not present

## 2017-05-03 DIAGNOSIS — L89303 Pressure ulcer of unspecified buttock, stage 3: Secondary | ICD-10-CM | POA: Diagnosis not present

## 2017-05-04 DIAGNOSIS — I5022 Chronic systolic (congestive) heart failure: Secondary | ICD-10-CM | POA: Diagnosis not present

## 2017-05-04 DIAGNOSIS — I4891 Unspecified atrial fibrillation: Secondary | ICD-10-CM | POA: Diagnosis not present

## 2017-05-04 DIAGNOSIS — L89154 Pressure ulcer of sacral region, stage 4: Secondary | ICD-10-CM | POA: Diagnosis not present

## 2017-05-04 DIAGNOSIS — Z960 Presence of urogenital implants: Secondary | ICD-10-CM | POA: Diagnosis not present

## 2017-05-04 DIAGNOSIS — E119 Type 2 diabetes mellitus without complications: Secondary | ICD-10-CM | POA: Diagnosis not present

## 2017-05-04 DIAGNOSIS — R531 Weakness: Secondary | ICD-10-CM | POA: Diagnosis not present

## 2017-05-04 DIAGNOSIS — I10 Essential (primary) hypertension: Secondary | ICD-10-CM | POA: Diagnosis not present

## 2017-05-04 DIAGNOSIS — L89153 Pressure ulcer of sacral region, stage 3: Secondary | ICD-10-CM | POA: Diagnosis not present

## 2017-05-04 DIAGNOSIS — Z9861 Coronary angioplasty status: Secondary | ICD-10-CM | POA: Diagnosis not present

## 2017-05-04 DIAGNOSIS — I82413 Acute embolism and thrombosis of femoral vein, bilateral: Secondary | ICD-10-CM | POA: Diagnosis not present

## 2017-05-04 DIAGNOSIS — Z8673 Personal history of transient ischemic attack (TIA), and cerebral infarction without residual deficits: Secondary | ICD-10-CM | POA: Diagnosis not present

## 2017-05-04 DIAGNOSIS — R5381 Other malaise: Secondary | ICD-10-CM | POA: Diagnosis not present

## 2017-05-04 DIAGNOSIS — R269 Unspecified abnormalities of gait and mobility: Secondary | ICD-10-CM | POA: Diagnosis not present

## 2017-05-04 DIAGNOSIS — I509 Heart failure, unspecified: Secondary | ICD-10-CM | POA: Diagnosis not present

## 2017-05-04 DIAGNOSIS — I110 Hypertensive heart disease with heart failure: Secondary | ICD-10-CM | POA: Diagnosis not present

## 2017-05-04 DIAGNOSIS — R509 Fever, unspecified: Secondary | ICD-10-CM | POA: Diagnosis not present

## 2017-05-04 DIAGNOSIS — I2510 Atherosclerotic heart disease of native coronary artery without angina pectoris: Secondary | ICD-10-CM | POA: Diagnosis not present

## 2017-05-04 DIAGNOSIS — E11622 Type 2 diabetes mellitus with other skin ulcer: Secondary | ICD-10-CM | POA: Diagnosis not present

## 2017-05-04 DIAGNOSIS — L98499 Non-pressure chronic ulcer of skin of other sites with unspecified severity: Secondary | ICD-10-CM | POA: Diagnosis not present

## 2017-05-04 DIAGNOSIS — I714 Abdominal aortic aneurysm, without rupture: Secondary | ICD-10-CM | POA: Diagnosis not present

## 2017-05-04 DIAGNOSIS — Z7901 Long term (current) use of anticoagulants: Secondary | ICD-10-CM | POA: Diagnosis not present

## 2017-05-04 DIAGNOSIS — N3090 Cystitis, unspecified without hematuria: Secondary | ICD-10-CM | POA: Diagnosis not present

## 2017-05-04 DIAGNOSIS — I82423 Acute embolism and thrombosis of iliac vein, bilateral: Secondary | ICD-10-CM | POA: Diagnosis not present

## 2017-05-04 DIAGNOSIS — N200 Calculus of kidney: Secondary | ICD-10-CM | POA: Diagnosis not present

## 2017-05-04 DIAGNOSIS — M8668 Other chronic osteomyelitis, other site: Secondary | ICD-10-CM | POA: Diagnosis not present

## 2017-05-04 DIAGNOSIS — N390 Urinary tract infection, site not specified: Secondary | ICD-10-CM | POA: Diagnosis not present

## 2017-05-04 DIAGNOSIS — T83518A Infection and inflammatory reaction due to other urinary catheter, initial encounter: Secondary | ICD-10-CM | POA: Diagnosis not present

## 2017-05-04 DIAGNOSIS — I2584 Coronary atherosclerosis due to calcified coronary lesion: Secondary | ICD-10-CM | POA: Diagnosis not present

## 2017-05-04 DIAGNOSIS — I252 Old myocardial infarction: Secondary | ICD-10-CM | POA: Diagnosis not present

## 2017-05-04 DIAGNOSIS — L89303 Pressure ulcer of unspecified buttock, stage 3: Secondary | ICD-10-CM | POA: Diagnosis not present

## 2017-07-15 DIAGNOSIS — R600 Localized edema: Secondary | ICD-10-CM | POA: Diagnosis not present

## 2017-07-15 DIAGNOSIS — I10 Essential (primary) hypertension: Secondary | ICD-10-CM | POA: Diagnosis not present

## 2017-07-15 DIAGNOSIS — E1140 Type 2 diabetes mellitus with diabetic neuropathy, unspecified: Secondary | ICD-10-CM | POA: Diagnosis not present

## 2017-07-15 DIAGNOSIS — Z125 Encounter for screening for malignant neoplasm of prostate: Secondary | ICD-10-CM | POA: Diagnosis not present

## 2017-07-15 DIAGNOSIS — E039 Hypothyroidism, unspecified: Secondary | ICD-10-CM | POA: Diagnosis not present

## 2017-07-30 DIAGNOSIS — R350 Frequency of micturition: Secondary | ICD-10-CM | POA: Diagnosis not present

## 2017-07-30 DIAGNOSIS — R21 Rash and other nonspecific skin eruption: Secondary | ICD-10-CM | POA: Diagnosis not present

## 2017-07-30 DIAGNOSIS — R110 Nausea: Secondary | ICD-10-CM | POA: Diagnosis not present

## 2017-08-06 DIAGNOSIS — Z9181 History of falling: Secondary | ICD-10-CM | POA: Diagnosis not present

## 2017-08-06 DIAGNOSIS — Z125 Encounter for screening for malignant neoplasm of prostate: Secondary | ICD-10-CM | POA: Diagnosis not present

## 2017-08-06 DIAGNOSIS — Z0000 Encounter for general adult medical examination without abnormal findings: Secondary | ICD-10-CM | POA: Diagnosis not present

## 2017-08-06 DIAGNOSIS — Z136 Encounter for screening for cardiovascular disorders: Secondary | ICD-10-CM | POA: Diagnosis not present

## 2017-08-06 DIAGNOSIS — Z1331 Encounter for screening for depression: Secondary | ICD-10-CM | POA: Diagnosis not present

## 2017-08-06 DIAGNOSIS — E785 Hyperlipidemia, unspecified: Secondary | ICD-10-CM | POA: Diagnosis not present

## 2017-08-06 DIAGNOSIS — Z139 Encounter for screening, unspecified: Secondary | ICD-10-CM | POA: Diagnosis not present

## 2017-08-13 DIAGNOSIS — E039 Hypothyroidism, unspecified: Secondary | ICD-10-CM | POA: Diagnosis not present

## 2017-08-15 DIAGNOSIS — E1140 Type 2 diabetes mellitus with diabetic neuropathy, unspecified: Secondary | ICD-10-CM | POA: Diagnosis not present

## 2017-08-15 DIAGNOSIS — I10 Essential (primary) hypertension: Secondary | ICD-10-CM | POA: Diagnosis not present

## 2017-08-15 DIAGNOSIS — M1711 Unilateral primary osteoarthritis, right knee: Secondary | ICD-10-CM | POA: Diagnosis not present

## 2017-08-15 DIAGNOSIS — E039 Hypothyroidism, unspecified: Secondary | ICD-10-CM | POA: Diagnosis not present

## 2017-08-15 DIAGNOSIS — I2510 Atherosclerotic heart disease of native coronary artery without angina pectoris: Secondary | ICD-10-CM | POA: Diagnosis not present

## 2017-09-10 DIAGNOSIS — Z79899 Other long term (current) drug therapy: Secondary | ICD-10-CM | POA: Diagnosis not present

## 2017-09-10 DIAGNOSIS — Z01818 Encounter for other preprocedural examination: Secondary | ICD-10-CM | POA: Diagnosis not present

## 2017-09-10 DIAGNOSIS — E559 Vitamin D deficiency, unspecified: Secondary | ICD-10-CM | POA: Diagnosis not present

## 2017-09-10 DIAGNOSIS — M79609 Pain in unspecified limb: Secondary | ICD-10-CM | POA: Diagnosis not present

## 2017-09-10 DIAGNOSIS — I517 Cardiomegaly: Secondary | ICD-10-CM | POA: Diagnosis not present

## 2017-09-15 DIAGNOSIS — E039 Hypothyroidism, unspecified: Secondary | ICD-10-CM | POA: Diagnosis not present

## 2017-09-15 DIAGNOSIS — J302 Other seasonal allergic rhinitis: Secondary | ICD-10-CM | POA: Diagnosis not present

## 2017-09-15 DIAGNOSIS — M1711 Unilateral primary osteoarthritis, right knee: Secondary | ICD-10-CM | POA: Diagnosis not present

## 2017-09-15 DIAGNOSIS — N390 Urinary tract infection, site not specified: Secondary | ICD-10-CM | POA: Diagnosis not present

## 2017-09-15 DIAGNOSIS — Z8619 Personal history of other infectious and parasitic diseases: Secondary | ICD-10-CM | POA: Diagnosis not present

## 2017-09-18 DIAGNOSIS — N390 Urinary tract infection, site not specified: Secondary | ICD-10-CM | POA: Diagnosis not present

## 2017-09-19 DIAGNOSIS — N390 Urinary tract infection, site not specified: Secondary | ICD-10-CM | POA: Diagnosis not present

## 2017-09-20 DIAGNOSIS — N390 Urinary tract infection, site not specified: Secondary | ICD-10-CM | POA: Diagnosis not present

## 2017-09-21 DIAGNOSIS — N390 Urinary tract infection, site not specified: Secondary | ICD-10-CM | POA: Diagnosis not present

## 2017-09-22 DIAGNOSIS — N390 Urinary tract infection, site not specified: Secondary | ICD-10-CM | POA: Diagnosis not present

## 2017-09-24 DIAGNOSIS — N390 Urinary tract infection, site not specified: Secondary | ICD-10-CM | POA: Diagnosis not present

## 2017-09-24 DIAGNOSIS — M47816 Spondylosis without myelopathy or radiculopathy, lumbar region: Secondary | ICD-10-CM | POA: Diagnosis not present

## 2017-09-24 DIAGNOSIS — Z981 Arthrodesis status: Secondary | ICD-10-CM | POA: Diagnosis not present

## 2017-09-24 DIAGNOSIS — Z139 Encounter for screening, unspecified: Secondary | ICD-10-CM | POA: Diagnosis not present

## 2017-09-24 DIAGNOSIS — M545 Low back pain: Secondary | ICD-10-CM | POA: Diagnosis not present

## 2017-09-24 DIAGNOSIS — R339 Retention of urine, unspecified: Secondary | ICD-10-CM | POA: Diagnosis not present

## 2017-09-25 DIAGNOSIS — R339 Retention of urine, unspecified: Secondary | ICD-10-CM | POA: Diagnosis not present

## 2017-09-25 DIAGNOSIS — R109 Unspecified abdominal pain: Secondary | ICD-10-CM | POA: Diagnosis not present

## 2017-09-25 DIAGNOSIS — N390 Urinary tract infection, site not specified: Secondary | ICD-10-CM | POA: Diagnosis not present

## 2017-09-25 DIAGNOSIS — Z79899 Other long term (current) drug therapy: Secondary | ICD-10-CM | POA: Diagnosis not present

## 2017-10-22 ENCOUNTER — Other Ambulatory Visit: Payer: Self-pay

## 2017-10-23 DIAGNOSIS — R339 Retention of urine, unspecified: Secondary | ICD-10-CM | POA: Diagnosis not present

## 2017-11-18 NOTE — Progress Notes (Signed)
Cardiology Office Note:    Date:  11/19/2017   ID:  [name] [name], DOB 01/21/1945, MRN [id number]  PCP:  Nicoletta Dress, MD  Cardiologist:  Shirlee More, MD   Referring MD: Nicoletta Dress, MD  ASSESSMENT:    1. Preop cardiovascular exam   2. Coronary artery disease of native artery of native heart with stable angina pectoris ([place])   3. Chronic anticoagulation   4. Ascending aorta dilatation (HCC)    PLAN:    In order of problems listed above:  1. His orthopedic procedure is elective intermediate risk.  His heart disease include CAD chronic stable does not require further preoperative evaluation heart failure which is compensated normal ejection fraction no evidence of fluid overload and presently not on a loop diuretic and from the patient's description paroxysmal atrial fibrillation with previous stroke and anticoagulated.  His exercise tolerance cannot be determined because of limitations with joint pain.  He will be required to stop his anticoagulant 2 to 3 days prior to surgery and please tell him when it is safe to resume postoperatively usually in the range of 2 to 3 days with major surgery.  He will require full dose and not prophylactic dose Eliquis with history of stroke and paroxysmal atrial fibrillation he can also withdraw aspirin in the perioperative.. 2. Stable CAD New York Heart Association class I he does not require preoperative ischemia evaluation 3. Instituted after stroke I do not have source records but he tells me he had atrial fibrillation and can withdraw his anticoagulant for his joint replacement surgery, please place in a telemetry bed postoperatively to monitor for recurrent atrial fibrillation and check EKG postoperative day 1.  If any problems please contact the cardiologist at [facility] who is functioning as part of [facility] 4. Followed by [facility] cardiothoracic surgery 5. Hypertension stable continue  his usual meds 6. He has CKD from his preoperative labs and will require recheck one in the hospital 7. Stable type 2 diabetes  Next appointment as needed   Medication Adjustments/Labs and Tests Ordered: Current medicines are reviewed at length with the patient today.  Concerns regarding medicines are outlined above.  Orders Placed This Encounter  Procedures  . EKG 12-Lead   No orders of the defined types were placed in this encounter.    Chief Complaint  Patient presents with  . Pre-op Exam    Dr Samule Dry    History of Present Illness:    [name] [name] is a 72 y.o. male who is being seen today for preoperative cardiovascular evaluation prior to elective total knee arthroplasty by Dr. Garrison Columbus.  His PCP is Nicoletta Dress, MD.  He was seen at [facility] cardioth[facility]acic surgery Dr. Lunette Stands 03/20/2017 with a 4.3 cm dilation ascending aorta found incidentally on CT scan. He was seen by me 09/06/2014 my records relate he has a history of diastolic heart failure hypertension and stroke.  Patient tells me he has atrial fibrillation and has been anticoagulated with Eliquis and also has a background history of CAD with remote PCI in 2003 along with mild aortic regurgitation regurgitation and orthostatic hypotension.  Is been under the care of the [facility] really is done exceptionally well he has had no chest pain shortness of breath edema palpitations syncope or TIA he is in sinus rhythm today echocardiogram performed in March 2019 shows normal ejection fraction with mild mitral regurgitation.  He has had no ischemia  evaluation recently and no bleeding complications anticoagulant he is now recommended for total knee arthroplasty  Past Medical History:  Diagnosis Date  . Acid reflux   . Acute thigh pain   . Balanitis   . BPH (benign prostatic hyperplasia)   . CHF (congestive heart failure) ([place])   . Coronary artery disease   . Diabetes ([place])   . Enuresis   . Hemospermia   .  Hesitancy   . High cholesterol   . Hypertension   . Lumbar disc narrowing   . Lumbar radiculitis   . Penile rash   . Stroke ([place])   . Syncope and collapse   . Urinary retention   . UTI (urinary tract infection)     Past Surgical History:  Procedure Laterality Date  . APPENDECTOMY    . BACK SURGERY    . CARDIAC CATHETERIZATION    . CARDIAC SURGERY    . CORONARY ANGIOPLASTY    . GALLBLADDER SURGERY      Current Medications: Current Meds  Medication Sig  . amLODipine (NORVASC) 10 MG tablet Take 1 tablet by mouth daily.  Marland Kitchen apixaban (ELIQUIS) 5 MG TABS tablet Take 5 mg by mouth 2 (two) times daily.  Marland Kitchen ascorbic acid (VITAMIN C) 500 MG tablet Take 500 mg by mouth daily.  . carvedilol (COREG) 25 MG tablet Take 1 tablet by mouth 2 (two) times daily.  . Cholecalciferol (VITAMIN D-1000 MAX ST) 1000 units tablet Take 2 tablets by mouth daily.   . ferrous sulfate 325 (65 FE) MG tablet Take 1 tablet by mouth daily.   Marland Kitchen glucose blood test strip 1 each by Other route daily. Use as instructed  . hydrALAZINE (APRESOLINE) 25 MG tablet Take 1 tablet by mouth 2 (two) times daily.  Marland Kitchen ibuprofen (ADVIL,MOTRIN) 800 MG tablet Take 800 mg by mouth every 8 (eight) hours.  . Lancets Misc. (ACCU-CHEK FASTCLIX LANCET) KIT 1 Device by Does not apply route daily.  Marland Kitchen levocetirizine (XYZAL) 5 MG tablet Take 5 mg by mouth daily.  Marland Kitchen levothyroxine (SYNTHROID, LEVOTHROID) 112 MCG tablet Take 1 tablet by mouth daily.   Marland Kitchen losartan (COZAAR) 100 MG tablet Take 50 mg by mouth daily.  . Magnesium Oxide 420 MG TABS Take 2 tablets by mouth 2 (two) times daily.   Marland Kitchen oxybutynin (DITROPAN-XL) 5 MG 24 hr tablet Take 1 tablet by mouth 2 (two) times daily.   . pantoprazole (PROTONIX) 40 MG tablet Take 1 tablet by mouth daily.  . polyethylene glycol (MIRALAX / GLYCOLAX) packet Take 17 g by mouth 2 (two) times daily.  . sertraline (ZOLOFT) 100 MG tablet Take 1 tablet by mouth daily.   . tamsulosin (FLOMAX) 0.4 MG CAPS capsule  Take 0.4 mg by mouth at bedtime.  . temazepam (RESTORIL) 30 MG capsule Take 30 mg by mouth at bedtime as needed for sleep.  . traZODone (DESYREL) 150 MG tablet Take 1 tablet by mouth at bedtime.      Allergies:   Quinapril; Finasteride; Jalyn [dutasteride-tamsulosin hcl]; Lasix [furosemide]; and Prazosin   Social History   Socioeconomic History  . Marital status: Married    Spouse name: Not on file  . Number of children: Not on file  . Years of education: Not on file  . Highest education level: Not on file  Occupational History  . Not on file  Social Needs  . Financial resource strain: Not on file  . Food insecurity:    Worry: Not on file  Inability: Not on file  . Transportation needs:    Medical: Not on file    Non-medical: Not on file  Tobacco Use  . Smoking status: Never Smoker  . Smokeless tobacco: Never Used  Substance and Sexual Activity  . Alcohol use: Not Currently  . Drug use: Not Currently  . Sexual activity: Not on file  Lifestyle  . Physical activity:    Days per week: Not on file    Minutes per session: Not on file  . Stress: Not on file  Relationships  . Social connections:    Talks on phone: Not on file    Gets together: Not on file    Attends religious service: Not on file    Active member of club or organization: Not on file    Attends meetings of clubs or organizations: Not on file    Relationship status: Not on file  Other Topics Concern  . Not on file  Social History Narrative  . Not on file     Family History: The patient's family history includes Heart attack in his father; Heart disease in his father; Lymphoma in his mother; Stroke in his brother.  ROS:   Review of Systems  Constitution: Negative.  HENT: Negative.   Eyes: Negative.   Cardiovascular: Negative.   Respiratory: Negative.   Endocrine: Negative.   Hematologic/Lymphatic: Negative.   Skin: Negative.   Musculoskeletal: Positive for back pain and joint pain.    Gastrointestinal: Positive for diarrhea.  Genitourinary: Negative.   Neurological: Negative.   Psychiatric/Behavioral: Negative.   Allergic/Immunologic: Negative.    Please see the history of present illness.     All other systems reviewed and are negative.  EKGs/Labs/Other Studies Reviewed:    The following studies were reviewed today: Prior to visit I reviewed my previous records from August 2016 as well as CT surgery consultation [facility] this year.  EKG:  EKG is  ordered today.  The ekg ordered today demonstrates sinus rhythm first-degree heart block right bundle branch block  Recent Labs:   0 09/10/2017 CBC is normal INR normal CMP shows CKD with creatinine 2.0 GFR 33 cc potassium 5.2 hemoglobin A1c 5.5 No results found for requested labs within last 8760 hours.  Recent Lipid Panel No results found for: CHOL, TRIG, HDL, CHOLHDL, VLDL, LDLCALC, LDLDIRECT  Physical Exam:    VS:  BP 122/60 (BP Location: Left Arm, Patient Position: Sitting, Cuff Size: Normal)   Pulse (!) 51   Ht 5' 11"  (1.803 m)   Wt 206 lb 12.8 oz (93.8 kg)   SpO2 97%   BMI 28.84 kg/m     Wt Readings from Last 3 Encounters:  11/19/17 206 lb 12.8 oz (93.8 kg)     GEN:  Well nourished, well developed in no acute distress HEENT: Normal NECK: No JVD; No carotid bruits LYMPHATICS: No lymphadenopathy CARDIAC: RRR, no murmurs, rubs, gallops RESPIRATORY:  Clear to auscultation without rales, wheezing or rhonchi  ABDOMEN: Soft, non-tender, non-distended MUSCULOSKELETAL:  No edema; No deformity  SKIN: Warm and dry NEUROLOGIC:  Alert and oriented x 3 PSYCHIATRIC:  Normal affect     Signed, Shirlee More, MD  11/19/2017 12:03 PM    [place]

## 2017-11-19 ENCOUNTER — Encounter: Payer: Self-pay | Admitting: Cardiology

## 2017-11-19 ENCOUNTER — Ambulatory Visit: Payer: Medicare Other | Admitting: Cardiology

## 2017-11-19 VITALS — BP 122/60 | HR 51 | Ht 71.0 in | Wt 206.8 lb

## 2017-11-19 DIAGNOSIS — Z01810 Encounter for preprocedural cardiovascular examination: Secondary | ICD-10-CM | POA: Diagnosis not present

## 2017-11-19 DIAGNOSIS — Z7901 Long term (current) use of anticoagulants: Secondary | ICD-10-CM

## 2017-11-19 DIAGNOSIS — I25118 Atherosclerotic heart disease of native coronary artery with other forms of angina pectoris: Secondary | ICD-10-CM | POA: Diagnosis not present

## 2017-11-19 DIAGNOSIS — I77810 Thoracic aortic ectasia: Secondary | ICD-10-CM

## 2017-11-19 HISTORY — DX: Long term (current) use of anticoagulants: Z79.01

## 2017-11-19 NOTE — Patient Instructions (Signed)
Medication Instructions:  Your physician recommends that you continue on your current medications as directed. Please refer to the Current Medication list given to you today.  If you need a refill on your cardiac medications before your next appointment, please call your pharmacy.   Lab work: None  If you have labs (blood work) drawn today and your tests are completely normal, you will receive your results only by: . MyChart Message (if you have MyChart) OR . A paper copy in the mail If you have any lab test that is abnormal or we need to change your treatment, we will call you to review the results.  Testing/Procedures: You had an EKG today.   Follow-Up: At [facility], you and your health needs are our priority.  As part of our continuing mission to provide you with exceptional heart care, we have created designated Provider Care Teams.  These Care Teams include your primary Cardiologist (physician) and Advanced Practice Providers (APPs -  Physician Assistants and Nurse Practitioners) who all work together to provide you with the care you need, when you need it. You will need a follow up appointment as needed with Dr. Munley if symptoms worsen or fail to improve.    

## 2018-04-24 DIAGNOSIS — J301 Allergic rhinitis due to pollen: Secondary | ICD-10-CM | POA: Diagnosis not present

## 2018-06-24 DIAGNOSIS — G4700 Insomnia, unspecified: Secondary | ICD-10-CM | POA: Diagnosis not present

## 2018-07-01 ENCOUNTER — Other Ambulatory Visit: Payer: Self-pay | Admitting: *Deleted

## 2018-07-01 NOTE — Patient Outreach (Signed)
[place] [facility]) Care Management  07/01/2018  [name] [name] [birth year] [id number]   Telephone Screen  Referral Date:  06/29/2018 Referral Source:  [facility] High Risk List Reason for Referral:  Assess Needs Insurance:  [employer]   Outreach Attempt:  Outreach attempt #1 to patient for [facility] high risk screening.  Home telephone number not working. Patient's mobile line listed busy on multiple attempts.  Unable to leave voice message.   Plan: RN Health Coach will send unsuccessful outreach letter to patient.  RN Health Coach will make another outreach attempt to patient within 3-4 business days     [place] [phone] [email or web address]

## 2018-07-02 ENCOUNTER — Other Ambulatory Visit: Payer: Self-pay | Admitting: *Deleted

## 2018-07-02 NOTE — Patient Outreach (Signed)
[place] [facility]) Care Management  07/02/2018  [name] [name] 12/03/1945 [id number]   Telephone Screen  Referral Date:  06/29/2018 Referral Source:  [facility] High Risk List Reason for Referral:  Assess Needs Insurance:  [employer]   Outreach Attempt:  Outreach attempt #2 to patient for [facility] high risk screening.  Home telephone number not working. Patient's mobile line listed busy on multiple attempts.  Unable to leave voice message.   Plan:  RN Health Coach will make another outreach attempt to patient within 3-4 business days    [place] [phone] [name]

## 2018-07-07 ENCOUNTER — Other Ambulatory Visit: Payer: Self-pay | Admitting: *Deleted

## 2018-07-07 NOTE — Patient Outreach (Signed)
[place] [facility]) Care Management  07/07/2018  [name] [name] 03/03/1945 [id number]   Telephone Screen  Referral Date:06/29/2018 Referral Source:[employer] High Risk List Reason for Referral:Assess Needs Insurance:[employer] Medicare   Outreach Attempt:Outreach attempt #2to patient for [facility] high risk screening.Home telephone number not working. Patient's mobile line listed busy on multiple attempts. Unable to leave voice message.  Plan:  RN Health Coach will close case if no return call from patient within 10 day time period from Unsuccessful Letter being mailed to patient.  [place] [phone] [name]

## 2018-07-14 ENCOUNTER — Other Ambulatory Visit: Payer: Self-pay | Admitting: *Deleted

## 2018-07-14 NOTE — Patient Outreach (Signed)
[place] [facility]) Care Management  07/14/2018  KJUAN SEIPP [birth year] [id number]   Telephone Screen/Case Closure  Referral Date:06/29/2018 Referral Source:[employer] High Risk List Reason for Referral:Assess Needs Insurance:[employer] Medicare   Outreach Attempt:  Multiple attempts to establish contact with patient without success. No response from letter mailed to patient. Case is being closed at this time.   Plan:  RN Health Coach will make patient inactive with [facility] due to inability to establish contact with patient.  [place] [phone] [email or web address]

## 2018-08-11 DIAGNOSIS — Z9181 History of falling: Secondary | ICD-10-CM | POA: Diagnosis not present

## 2018-08-11 DIAGNOSIS — Z1211 Encounter for screening for malignant neoplasm of colon: Secondary | ICD-10-CM | POA: Diagnosis not present

## 2018-08-11 DIAGNOSIS — E785 Hyperlipidemia, unspecified: Secondary | ICD-10-CM | POA: Diagnosis not present

## 2018-08-11 DIAGNOSIS — Z0000 Encounter for general adult medical examination without abnormal findings: Secondary | ICD-10-CM | POA: Diagnosis not present

## 2018-08-19 DIAGNOSIS — E1165 Type 2 diabetes mellitus with hyperglycemia: Secondary | ICD-10-CM | POA: Diagnosis not present

## 2018-08-19 DIAGNOSIS — Z9861 Coronary angioplasty status: Secondary | ICD-10-CM | POA: Diagnosis not present

## 2018-08-19 DIAGNOSIS — I10 Essential (primary) hypertension: Secondary | ICD-10-CM | POA: Diagnosis not present

## 2018-08-19 DIAGNOSIS — E1140 Type 2 diabetes mellitus with diabetic neuropathy, unspecified: Secondary | ICD-10-CM | POA: Diagnosis not present

## 2018-08-19 DIAGNOSIS — I2510 Atherosclerotic heart disease of native coronary artery without angina pectoris: Secondary | ICD-10-CM | POA: Diagnosis not present

## 2018-10-03 DIAGNOSIS — N390 Urinary tract infection, site not specified: Secondary | ICD-10-CM | POA: Diagnosis not present

## 2018-10-28 DIAGNOSIS — Z9861 Coronary angioplasty status: Secondary | ICD-10-CM | POA: Diagnosis not present

## 2018-10-28 DIAGNOSIS — E1165 Type 2 diabetes mellitus with hyperglycemia: Secondary | ICD-10-CM | POA: Diagnosis not present

## 2018-10-28 DIAGNOSIS — E039 Hypothyroidism, unspecified: Secondary | ICD-10-CM | POA: Diagnosis not present

## 2018-10-28 DIAGNOSIS — E1140 Type 2 diabetes mellitus with diabetic neuropathy, unspecified: Secondary | ICD-10-CM | POA: Diagnosis not present

## 2018-10-28 DIAGNOSIS — I10 Essential (primary) hypertension: Secondary | ICD-10-CM | POA: Diagnosis not present

## 2018-10-28 DIAGNOSIS — I2510 Atherosclerotic heart disease of native coronary artery without angina pectoris: Secondary | ICD-10-CM | POA: Diagnosis not present

## 2018-10-28 DIAGNOSIS — E785 Hyperlipidemia, unspecified: Secondary | ICD-10-CM | POA: Diagnosis not present

## 2018-11-28 DIAGNOSIS — J019 Acute sinusitis, unspecified: Secondary | ICD-10-CM | POA: Diagnosis not present

## 2018-11-28 DIAGNOSIS — I10 Essential (primary) hypertension: Secondary | ICD-10-CM | POA: Diagnosis not present

## 2018-11-28 DIAGNOSIS — E039 Hypothyroidism, unspecified: Secondary | ICD-10-CM | POA: Diagnosis not present

## 2018-11-28 DIAGNOSIS — N1831 Chronic kidney disease, stage 3a: Secondary | ICD-10-CM | POA: Diagnosis not present

## 2019-02-22 ENCOUNTER — Other Ambulatory Visit: Payer: Self-pay

## 2019-02-22 NOTE — Patient Outreach (Signed)
[place] [facility]) Care Management  02/22/2019  [name] [name] 05/26/1945 [id number]   Medication Adherence call to Mr. [name] [name] ,patinets telephone number is disconnected . Patient is showing past due on Glimepiride 2 mg under [place].  [place] Management Direct Dial [phone]  Fax [phone] [email or web address]

## 2019-03-15 DIAGNOSIS — M48061 Spinal stenosis, lumbar region without neurogenic claudication: Secondary | ICD-10-CM | POA: Diagnosis not present

## 2019-03-15 DIAGNOSIS — R2689 Other abnormalities of gait and mobility: Secondary | ICD-10-CM | POA: Diagnosis not present

## 2019-04-28 DIAGNOSIS — N1831 Chronic kidney disease, stage 3a: Secondary | ICD-10-CM | POA: Diagnosis not present

## 2019-04-28 DIAGNOSIS — Z9861 Coronary angioplasty status: Secondary | ICD-10-CM | POA: Diagnosis not present

## 2019-04-28 DIAGNOSIS — E039 Hypothyroidism, unspecified: Secondary | ICD-10-CM | POA: Diagnosis not present

## 2019-04-28 DIAGNOSIS — I2510 Atherosclerotic heart disease of native coronary artery without angina pectoris: Secondary | ICD-10-CM | POA: Diagnosis not present

## 2019-04-28 DIAGNOSIS — E1142 Type 2 diabetes mellitus with diabetic polyneuropathy: Secondary | ICD-10-CM | POA: Diagnosis not present

## 2019-04-28 DIAGNOSIS — E785 Hyperlipidemia, unspecified: Secondary | ICD-10-CM | POA: Diagnosis not present

## 2019-05-17 ENCOUNTER — Telehealth: Payer: Self-pay | Admitting: Cardiology

## 2019-05-17 DIAGNOSIS — S0003XA Contusion of scalp, initial encounter: Secondary | ICD-10-CM | POA: Diagnosis not present

## 2019-05-17 DIAGNOSIS — R21 Rash and other nonspecific skin eruption: Secondary | ICD-10-CM | POA: Diagnosis not present

## 2019-05-17 NOTE — Telephone Encounter (Signed)
Please find out from patient to understand whether the source of his falls.  His imbalance issues or has he been dizzy?  Also please understand if he went to the ED to get a CT scan of his head after his falls? In the meantime - I will like to see him this week to understand more of what is going on. Please add him on to the my schedule.

## 2019-05-17 NOTE — Telephone Encounter (Signed)
Pt c/o medication issue:  1. Name of Medication: apixaban (ELIQUIS) 5 MG TABS tablet  2. How are you currently taking this medication (dosage and times per day)? 1 tablet twice a day  3. Are you having a reaction (difficulty breathing--STAT)? no  4. What is your medication issue? Patient states he has fallen several times and has a knot on his head. He states he saw his PCP who recommended he call as ask Dr. Bettina Gavia if he should hold the eliquis due to his injury. Please advise.

## 2019-05-18 DIAGNOSIS — M545 Low back pain: Secondary | ICD-10-CM | POA: Diagnosis not present

## 2019-05-18 NOTE — Telephone Encounter (Signed)
I spoke to the patient just now and he let me know that he actually tripped over some shoes and fell. He said that it had nothing to do with dizziness or anything like that. He said he did not go to the ED for a CT scan or anything but he did go to see his PCP. I scheduled him to see Dr. Bettina Gavia on 05/26/19 per Dr. Terrial Rhodes verbal order.    Encouraged patient to call back with any questions or concerns.

## 2019-05-24 ENCOUNTER — Other Ambulatory Visit: Payer: Self-pay

## 2019-05-26 ENCOUNTER — Ambulatory Visit: Payer: Medicare Other | Admitting: Cardiology

## 2019-05-26 NOTE — Progress Notes (Deleted)
Cardiology Office Note:    Date:  05/26/2019   ID:  AC GLADE, DOB December 22, 1945, MRN [id number]  PCP:  Nicoletta Dress, MD  Cardiologist:  Shirlee More, MD    Referring MD: Nicoletta Dress, MD    ASSESSMENT:    No diagnosis found. PLAN:    In order of problems listed above:  1. ***   Next appointment: ***   Medication Adjustments/Labs and Tests Ordered: Current medicines are reviewed at length with the patient today.  Concerns regarding medicines are outlined above.  No orders of the defined types were placed in this encounter.  No orders of the defined types were placed in this encounter.   No chief complaint on file.   History of Present Illness:    [name] [name] is a 74 y.o. male with a hx of 43 mm ascending aorta aneurysm last seen 11/19/2017.He was seen by me 09/06/2014 my records relate he has a history of diastolic heart failure hypertension and stroke.  Patient tells me he has atrial fibrillation and has been anticoagulated with Eliquis and also has a background history of stroke, falls, CAD with remote PCI in 2003 along with mild aortic regurgitation regurgitation and orthostatic hypotension. He phoned our triage line after a fall and was scheduled with me.  Review of records in epic show other diagnoses including aortic regurgitation abdominal aortic aneurysm and hypothyroidism.  His EKG is described as showing first-degree AV block and right bundle branch block.  He had an echocardiogram performed at [facility] February 2019 which showed normal left ventricular size function EF 60 to 65% with grade 1 diastolic dysfunction there is mitral under calcification and mild mitral regurgitation the aortic root was described as being mildly dilated and the ascending aorta moderately dilated. Compliance with diet, lifestyle and medications: *** Past Medical History:  Diagnosis Date  . Acid reflux   . Acute thigh pain   . Balanitis   . BPH (benign  prostatic hyperplasia)   . CHF (congestive heart failure) ([place])   . Coronary artery disease   . Diabetes ([place])   . Enuresis   . Hemospermia   . Hesitancy   . High cholesterol   . Hypertension   . Lumbar disc narrowing   . Lumbar radiculitis   . Penile rash   . Stroke ([place])   . Syncope and collapse   . Urinary retention   . UTI (urinary tract infection)     Past Surgical History:  Procedure Laterality Date  . APPENDECTOMY    . BACK SURGERY    . CARDIAC CATHETERIZATION    . CARDIAC SURGERY    . CORONARY ANGIOPLASTY    . GALLBLADDER SURGERY      Current Medications: No outpatient medications have been marked as taking for the 05/26/19 encounter (Appointment) with Richardo Priest, MD.     Allergies:   Quinapril, Finasteride, Theodoro Doing hcl], Lasix [furosemide], and Prazosin   Social History   Socioeconomic History  . Marital status: Married    Spouse name: Not on file  . Number of children: Not on file  . Years of education: Not on file  . Highest education level: Not on file  Occupational History  . Not on file  Tobacco Use  . Smoking status: Never Smoker  . Smokeless tobacco: Never Used  Substance and Sexual Activity  . Alcohol use: Not Currently  . Drug use: Not Currently  . Sexual activity: Not on file  Other Topics Concern  . Not on file  Social History Narrative  . Not on file   Social Determinants of Health   Financial Resource Strain:   . Difficulty of Paying Living Expenses:   Food Insecurity:   . Worried About Charity fundraiser in the Last Year:   . Arboriculturist in the Last Year:   Transportation Needs:   . Film/video editor (Medical):   Marland Kitchen Lack of Transportation (Non-Medical):   Physical Activity:   . Days of Exercise per Week:   . Minutes of Exercise per Session:   Stress:   . Feeling of Stress :   Social Connections:   . Frequency of Communication with Friends and Family:   . Frequency of Social Gatherings  with Friends and Family:   . Attends Religious Services:   . Active Member of Clubs or Organizations:   . Attends Archivist Meetings:   Marland Kitchen Marital Status:      Family History: The patient's ***family history includes Heart attack in his father; Heart disease in his father; Lymphoma in his mother; Stroke in his brother. ROS:   Please see the history of present illness.    All other systems reviewed and are negative.  EKGs/Labs/Other Studies Reviewed:    The following studies were reviewed today:  EKG:  EKG ordered today and personally reviewed.  The ekg ordered today demonstrates ***  Recent Labs: No results found for requested labs within last 8760 hours.  Recent Lipid Panel No results found for: CHOL, TRIG, HDL, CHOLHDL, VLDL, LDLCALC, LDLDIRECT  Physical Exam:    VS:  There were no vitals taken for this visit.    Wt Readings from Last 3 Encounters:  11/19/17 206 lb 12.8 oz (93.8 kg)     GEN: *** Well nourished, well developed in no acute distress HEENT: Normal NECK: No JVD; No carotid bruits LYMPHATICS: No lymphadenopathy CARDIAC: ***RRR, no murmurs, rubs, gallops RESPIRATORY:  Clear to auscultation without rales, wheezing or rhonchi  ABDOMEN: Soft, non-tender, non-distended MUSCULOSKELETAL:  No edema; No deformity  SKIN: Warm and dry NEUROLOGIC:  Alert and oriented x 3 PSYCHIATRIC:  Normal affect    Signed, Shirlee More, MD  05/26/2019 10:38 AM    [facility]

## 2019-06-16 DIAGNOSIS — Z79899 Other long term (current) drug therapy: Secondary | ICD-10-CM | POA: Diagnosis not present

## 2019-06-16 DIAGNOSIS — M5136 Other intervertebral disc degeneration, lumbar region: Secondary | ICD-10-CM | POA: Diagnosis not present

## 2019-06-16 DIAGNOSIS — G894 Chronic pain syndrome: Secondary | ICD-10-CM | POA: Diagnosis not present

## 2019-06-16 DIAGNOSIS — M47817 Spondylosis without myelopathy or radiculopathy, lumbosacral region: Secondary | ICD-10-CM | POA: Diagnosis not present

## 2019-06-16 DIAGNOSIS — M542 Cervicalgia: Secondary | ICD-10-CM | POA: Diagnosis not present

## 2019-06-16 DIAGNOSIS — Z79891 Long term (current) use of opiate analgesic: Secondary | ICD-10-CM | POA: Diagnosis not present

## 2019-06-23 ENCOUNTER — Other Ambulatory Visit: Payer: Self-pay | Admitting: Pain Medicine

## 2019-06-23 DIAGNOSIS — M542 Cervicalgia: Secondary | ICD-10-CM

## 2019-06-24 ENCOUNTER — Other Ambulatory Visit: Payer: Self-pay | Admitting: Pain Medicine

## 2019-06-30 DIAGNOSIS — M792 Neuralgia and neuritis, unspecified: Secondary | ICD-10-CM | POA: Diagnosis not present

## 2019-06-30 DIAGNOSIS — G894 Chronic pain syndrome: Secondary | ICD-10-CM | POA: Diagnosis not present

## 2019-07-14 DIAGNOSIS — G894 Chronic pain syndrome: Secondary | ICD-10-CM | POA: Diagnosis not present

## 2019-07-14 DIAGNOSIS — Z79891 Long term (current) use of opiate analgesic: Secondary | ICD-10-CM | POA: Diagnosis not present

## 2019-07-14 DIAGNOSIS — M47817 Spondylosis without myelopathy or radiculopathy, lumbosacral region: Secondary | ICD-10-CM | POA: Diagnosis not present

## 2019-07-14 DIAGNOSIS — Z79899 Other long term (current) drug therapy: Secondary | ICD-10-CM | POA: Diagnosis not present

## 2019-07-14 DIAGNOSIS — M961 Postlaminectomy syndrome, not elsewhere classified: Secondary | ICD-10-CM | POA: Diagnosis not present

## 2019-07-14 DIAGNOSIS — M5136 Other intervertebral disc degeneration, lumbar region: Secondary | ICD-10-CM | POA: Diagnosis not present

## 2019-07-28 DIAGNOSIS — I2510 Atherosclerotic heart disease of native coronary artery without angina pectoris: Secondary | ICD-10-CM | POA: Diagnosis not present

## 2019-07-28 DIAGNOSIS — E039 Hypothyroidism, unspecified: Secondary | ICD-10-CM | POA: Diagnosis not present

## 2019-07-28 DIAGNOSIS — Z9861 Coronary angioplasty status: Secondary | ICD-10-CM | POA: Diagnosis not present

## 2019-07-28 DIAGNOSIS — E1142 Type 2 diabetes mellitus with diabetic polyneuropathy: Secondary | ICD-10-CM | POA: Diagnosis not present

## 2019-07-28 DIAGNOSIS — I10 Essential (primary) hypertension: Secondary | ICD-10-CM | POA: Diagnosis not present

## 2019-07-28 DIAGNOSIS — N1831 Chronic kidney disease, stage 3a: Secondary | ICD-10-CM | POA: Diagnosis not present

## 2019-07-28 DIAGNOSIS — E785 Hyperlipidemia, unspecified: Secondary | ICD-10-CM | POA: Diagnosis not present

## 2019-08-17 DIAGNOSIS — E785 Hyperlipidemia, unspecified: Secondary | ICD-10-CM | POA: Diagnosis not present

## 2019-08-17 DIAGNOSIS — Z0000 Encounter for general adult medical examination without abnormal findings: Secondary | ICD-10-CM | POA: Diagnosis not present

## 2019-09-14 DIAGNOSIS — M51369 Other intervertebral disc degeneration, lumbar region without mention of lumbar back pain or lower extremity pain: Secondary | ICD-10-CM

## 2019-09-14 DIAGNOSIS — Z981 Arthrodesis status: Secondary | ICD-10-CM | POA: Insufficient documentation

## 2019-09-14 DIAGNOSIS — M4802 Spinal stenosis, cervical region: Secondary | ICD-10-CM

## 2019-09-14 DIAGNOSIS — Z0189 Encounter for other specified special examinations: Secondary | ICD-10-CM | POA: Diagnosis not present

## 2019-09-14 DIAGNOSIS — M5136 Other intervertebral disc degeneration, lumbar region: Secondary | ICD-10-CM | POA: Insufficient documentation

## 2019-09-14 DIAGNOSIS — G8929 Other chronic pain: Secondary | ICD-10-CM | POA: Diagnosis not present

## 2019-09-14 HISTORY — DX: Other intervertebral disc degeneration, lumbar region without mention of lumbar back pain or lower extremity pain: M51.369

## 2019-09-14 HISTORY — DX: Arthrodesis status: Z98.1

## 2019-09-14 HISTORY — DX: Spinal stenosis, cervical region: M48.02

## 2019-09-14 HISTORY — DX: Other intervertebral disc degeneration, lumbar region: M51.36

## 2019-10-21 DIAGNOSIS — Z79891 Long term (current) use of opiate analgesic: Secondary | ICD-10-CM | POA: Diagnosis not present

## 2019-10-21 DIAGNOSIS — M4802 Spinal stenosis, cervical region: Secondary | ICD-10-CM | POA: Diagnosis not present

## 2019-10-21 DIAGNOSIS — G8929 Other chronic pain: Secondary | ICD-10-CM | POA: Diagnosis not present

## 2019-10-21 DIAGNOSIS — M5136 Other intervertebral disc degeneration, lumbar region: Secondary | ICD-10-CM | POA: Diagnosis not present

## 2019-10-21 DIAGNOSIS — Z981 Arthrodesis status: Secondary | ICD-10-CM | POA: Diagnosis not present

## 2019-10-22 DIAGNOSIS — K580 Irritable bowel syndrome with diarrhea: Secondary | ICD-10-CM | POA: Diagnosis not present

## 2019-11-03 DIAGNOSIS — E1142 Type 2 diabetes mellitus with diabetic polyneuropathy: Secondary | ICD-10-CM | POA: Diagnosis not present

## 2019-11-03 DIAGNOSIS — I10 Essential (primary) hypertension: Secondary | ICD-10-CM | POA: Diagnosis not present

## 2019-11-03 DIAGNOSIS — I2510 Atherosclerotic heart disease of native coronary artery without angina pectoris: Secondary | ICD-10-CM | POA: Diagnosis not present

## 2019-11-03 DIAGNOSIS — E785 Hyperlipidemia, unspecified: Secondary | ICD-10-CM | POA: Diagnosis not present

## 2019-11-03 DIAGNOSIS — N1831 Chronic kidney disease, stage 3a: Secondary | ICD-10-CM | POA: Diagnosis not present

## 2019-11-03 DIAGNOSIS — Z9861 Coronary angioplasty status: Secondary | ICD-10-CM | POA: Diagnosis not present

## 2019-11-03 DIAGNOSIS — E039 Hypothyroidism, unspecified: Secondary | ICD-10-CM | POA: Diagnosis not present

## 2019-11-04 DIAGNOSIS — R410 Disorientation, unspecified: Secondary | ICD-10-CM | POA: Diagnosis not present

## 2020-01-04 DIAGNOSIS — Z981 Arthrodesis status: Secondary | ICD-10-CM | POA: Diagnosis not present

## 2020-01-04 DIAGNOSIS — M5136 Other intervertebral disc degeneration, lumbar region: Secondary | ICD-10-CM | POA: Diagnosis not present

## 2020-01-04 DIAGNOSIS — G8929 Other chronic pain: Secondary | ICD-10-CM | POA: Diagnosis not present

## 2020-01-04 DIAGNOSIS — Z9889 Other specified postprocedural states: Secondary | ICD-10-CM | POA: Diagnosis not present

## 2020-10-17 DIAGNOSIS — M545 Low back pain, unspecified: Secondary | ICD-10-CM | POA: Diagnosis not present

## 2020-10-17 DIAGNOSIS — M549 Dorsalgia, unspecified: Secondary | ICD-10-CM | POA: Diagnosis not present

## 2020-10-17 DIAGNOSIS — Z981 Arthrodesis status: Secondary | ICD-10-CM | POA: Diagnosis not present

## 2020-10-23 ENCOUNTER — Encounter: Payer: Self-pay | Admitting: Cardiology

## 2020-10-23 ENCOUNTER — Encounter: Payer: Self-pay | Admitting: *Deleted

## 2020-10-29 NOTE — Progress Notes (Deleted)
Cardiology Office Note:    Date:  10/29/2020   ID:  [name] [name], DOB 09-Mar-1945, MRN [id number]  PCP:  Nicoletta Dress, MD  Cardiologist:  Shirlee More, MD   Referring MD: Nicoletta Dress, MD  ASSESSMENT:    No diagnosis found. PLAN:    In order of problems listed above:  ***  Next appointment   Medication Adjustments/Labs and Tests Ordered: Current medicines are reviewed at length with the patient today.  Concerns regarding medicines are outlined above.  No orders of the defined types were placed in this encounter.  No orders of the defined types were placed in this encounter.    No chief complaint on file. ***  History of Present Illness:    [name] [name] is a 75 y.o. male who is being seen today to establish cardiology care at the request of Nicoletta Dress, MD.  He was seen by me 11/19/2017 with a history of dilation of the ascending aorta 43 mm hypertension diastolic heart failure stroke atrial fibrillation anticoagulant Eliquis and history of CAD with remote PCI in 2003 mild aortic regurgitation and orthostatic hypotension.  Chart review shows he had an echocardiogram performed 03/13/2017.  Left ventricle is normal in size with mild concentric LVH ejection fraction 60 to 65% with normal left ventricular systolic function right ventricle is normal in size and function left atrium is mildly dilated.  There is enlargement ascending aorta 48 mm by echocardiogram moderate aortic regurgitation and mild mitral regurgitation. CT of the abdomen and pelvis 03/26/2017 showed right iliofemoral deep vein thrombosis. CT of chest abdomen pelvis 12/25/2016 ascending aorta dilated measuring 43 mm and there was small thrombus present within the superior mesenteric vein. Past Medical History:  Diagnosis Date   Acid reflux    Acute blood loss anemia 02/06/2017   Last Assessment & Plan:  - Due to bleeding from wound. Plastic surgery was able to stop the bleeding and  Hgb has been stable.   Acute renal failure (ARF) ([place]) 11/29/2016   Last Assessment & Plan:  Patient had AKI early in his admission, with creatinine peaking at 3.5. Baseline is approx 1.  AKI resolved prior to discharge.   Acute thigh pain    Anasarca 12/21/2016   Last Assessment & Plan:  Due to hypoalbuminemia. This improved with improvement in the patient's nutrition.   Anemia 12/21/2016   Last Assessment & Plan:  S/p transfusions 11/30, 12/9, and 12/28/16.  Likely anemia of chronic disease.  B12 WNL.  Iron panel not consistent with iron deficiency.  FOBT negative.   Aortic aneurysm ([place]) 02/11/2017   Last Assessment & Plan:  - Demonstrated on imaging from last admission. Needs OP follow up with vascular, will place a Gold Card   Ascending aorta dilatation ([place]) 12/21/2016   Last Assessment & Plan:  With aortic regurgitation - mild to moderate. TTE showed mild to moderate aortic regurgitation with moderately dilated ascending aorta of 4.6cm He will need to follow up with vascular as an outpatient.   Balanitis    BPH (benign prostatic hyperplasia)    CHF (congestive heart failure) (HCC)    Chronic anticoagulation 11/19/2017   Chronic diastolic congestive heart failure ([place]) 02/04/2017   Last Assessment & Plan:  - No acute exacerbation. Low salt diet. Continue on coreg at discharge   Coronary artery disease involving native coronary artery of native heart 11/29/2016   remote PCI and 2 stents 2004, no NTG for > 27months  Last Assessment & Plan:  - Continue plavix and coreg   CVA (cerebral vascular accident) ([place]) 11/29/2016   Last Assessment & Plan:  - Continue on plavix   DDD (degenerative disc disease), lumbar 09/14/2019   Last Assessment & Plan:  Formatting of this note might be different from the original. see status post lumbar fusion   Debility 02/08/2017   Last Assessment & Plan:  PT consulted and low intensity post acute rehab was recommended. Discharge to SNF   Depression  12/21/2016   Last Assessment & Plan:  - Continue zoloft   Diabetes mellitus ([place]) 12/21/2016   Last Assessment & Plan:  Was not on any home meds at time of admission  HgbA1c 7.5 - Initiated insulin therapy while in the hospital due to elevated blood glucose. Discontinued megace as pt was eating a lot and blood glucose remained elevated.  - Continue lantus at 15u, 4u meal time lispro and SSI at discharge to SNF - May be best to transition to metformin at time of discharge from SNF, follow up   Diarrhea 12/21/2016   Last Assessment & Plan:  C Difficile negative -12/8 Likely due to tube feeds.  Flexiseal was placed.  Stools thickened with Guar gum and the discontinuation of tube feeds. Flexiseal was removed.   Enuresis    Essential hypertension 11/29/2016   Last Assessment & Plan:  Formatting of this note might be different from the original. - Continue coreg, hydralazine, and norvasc at increased dose of 10mg    Gastroesophageal reflux disease without esophagitis 02/04/2017   Last Assessment & Plan:  - Continue protonix   Hemospermia    Hesitancy    High cholesterol    Hydrocele 12/21/2016   Last Assessment & Plan:  Mildly complicated bilateral hydroceles with bilateral epididymal head cysts Case discussed with Urology and they say that these findings are benign and there is no concern for abscess.   Hypertension    Hypertensive heart disease with heart failure ([place]) 11/29/2016   Last Assessment & Plan:  - Continue coreg, hydralazine, and norvasc at increased dose of 10mg    Hypomagnesemia 02/05/2017   Last Assessment & Plan:  - Replaced PO during admission - Continue to monitor and replace prn at SNF   Infection associated with implanted penile prosthesis ([place]) 03/07/2017   Lumbar disc narrowing    Lumbar radiculitis    Metabolic encephalopathy 17/61/6073   Last Assessment & Plan:  Acute metabolic encephalopathy-multifactorial due to sepsis, hyponatremia, acute kidney injury, ventriculitis.  This resolved, though the patient appears to have some ongoing cognitive issues.   Obesity (BMI 30-39.9) 11/29/2016   Penile rash    Physical deconditioning 12/21/2016   Last Assessment & Plan:  Very poor functional status. Physical therapy was started. He has ongoing needs, and discharge to a skilled nursing facility was recommended.  He is being discharged to [facility].   Pressure injury of sacral region, stage 4 ([place]) 01/02/2017   Last Assessment & Plan:  MRI showed concern for abscess related to the sacral decubitus ulcer and enhancement of L5-S1 facet joints suggestive of degenerative/reactive change but cannot entirely exclude osteo given prior multifocal osteomyelitis and abscess.    Wound Cx: Proteus mirabilis, Klebsiella pneumoniae, and Enterococcus faecium.  - Plastic surgery consulted and s/p excision of the sacral    Severe protein-calorie malnutrition ([place]) 12/21/2016   Last Assessment & Plan:  The patient initially had a very poor appetite, and had to be kept on tube feeds for a  prolonged period after his ICU stay. His appetite gradually improved with the introduction of Remeron and Megace.  Tube feeds were eventually discontinued, and he was continued on a ground diet with supplements.   Small bowel obstruction ([place]) 12/21/2016   Last Assessment & Plan:  Resolved.   Spinal stenosis of cervical region 09/14/2019   Last Assessment & Plan:  Formatting of this note might be different from the original. See status post lumbar fusion plan   Status post lumbar spinal fusion 09/14/2019   Last Assessment & Plan:  Formatting of this note is different from the original. 75 year old male with chronic, axial low back pain, he is s/p multilevel fusion. Review of most recent MRI on care everywhere notes incompletely imaged postop changes of L3-5 PLIF with interbody grafts at L3-4 and L4-5. Laminectomy defects are present at L3-4 and L-5 with amorphous soft tissue in the laminectomy bed.    Stroke  [facility])    Superior mesenteric vein thrombosis ([place]) 12/26/2016   Last Assessment & Plan:  SMV thrombosis on CT abdomen Started on [facility] with lovenox initially. However US liver did not demonstrate any SMV thrombus. Considering that the CT read it as a tiny thrombus, it was likely an artifact. Anticoagulation was discontinued.   Syncope and collapse    Urinary retention    UTI (urinary tract infection)     Past Surgical History:  Procedure Laterality Date   APPENDECTOMY     BACK SURGERY     CARDIAC CATHETERIZATION     CARDIAC SURGERY     CORONARY ANGIOPLASTY     GALLBLADDER SURGERY     LAMINECTOMY     X2 With total of 4 rods in spine   REPLACEMENT TOTAL KNEE Left    SHOULDER SURGERY Right     Current Medications: No outpatient medications have been marked as taking for the 10/30/20 encounter (Appointment) with Richardo Priest, MD.     Allergies:   Quinapril, Codeine, Accupril [quinapril hcl], Finasteride, Jalyn [dutasteride-tamsulosin hcl], Lasix [furosemide], and Prazosin   Social History   Socioeconomic History   Marital status: Married    Spouse name: Not on file   Number of children: Not on file   Years of education: Not on file   Highest education level: Not on file  Occupational History   Not on file  Tobacco Use   Smoking status: Never   Smokeless tobacco: Never  Vaping Use   Vaping Use: Never used  Substance and Sexual Activity   Alcohol use: Never   Drug use: Never   Sexual activity: Not on file  Other Topics Concern   Not on file  Social History Narrative   Not on file   Social Determinants of Health   Financial Resource Strain: Not on file  Food Insecurity: Not on file  Transportation Needs: Not on file  Physical Activity: Not on file  Stress: Not on file  Social Connections: Not on file     Family History: The patient's ***family history includes Heart attack in his father; Heart disease in his father; Lymphoma in his mother; Stroke in his  brother.  ROS:   ROS Please see the history of present illness.    *** All other systems reviewed and are negative.  EKGs/Labs/Other Studies Reviewed:    The following studies were reviewed today: ***  EKG:  EKG is *** ordered today.  The ekg ordered today is personally reviewed and demonstrates ***  Recent Labs: No results found for  requested labs within last 8760 hours.  Recent Lipid Panel No results found for: CHOL, TRIG, HDL, CHOLHDL, VLDL, LDLCALC, LDLDIRECT  Physical Exam:    VS:  There were no vitals taken for this visit.    Wt Readings from Last 3 Encounters:  09/13/20 203 lb (92.1 kg)  11/19/17 206 lb 12.8 oz (93.8 kg)     GEN: *** Well nourished, well developed in no acute distress HEENT: Normal NECK: No JVD; No carotid bruits LYMPHATICS: No lymphadenopathy CARDIAC: ***RRR, no murmurs, rubs, gallops RESPIRATORY:  Clear to auscultation without rales, wheezing or rhonchi  ABDOMEN: Soft, non-tender, non-distended MUSCULOSKELETAL:  No edema; No deformity  SKIN: Warm and dry NEUROLOGIC:  Alert and oriented x 3 PSYCHIATRIC:  Normal affect     Signed, Shirlee More, MD  10/29/2020 4:48 PM    [facility]

## 2020-10-30 ENCOUNTER — Ambulatory Visit: Payer: Medicare Other | Admitting: Cardiology

## 2020-10-30 DIAGNOSIS — I110 Hypertensive heart disease with heart failure: Secondary | ICD-10-CM

## 2020-10-30 DIAGNOSIS — I82411 Acute embolism and thrombosis of right femoral vein: Secondary | ICD-10-CM

## 2020-10-30 DIAGNOSIS — Z7901 Long term (current) use of anticoagulants: Secondary | ICD-10-CM

## 2020-10-30 DIAGNOSIS — I7121 Aneurysm of the ascending aorta, without rupture: Secondary | ICD-10-CM

## 2020-10-30 DIAGNOSIS — E7800 Pure hypercholesterolemia, unspecified: Secondary | ICD-10-CM

## 2020-10-30 DIAGNOSIS — I25118 Atherosclerotic heart disease of native coronary artery with other forms of angina pectoris: Secondary | ICD-10-CM

## 2020-10-30 DIAGNOSIS — I351 Nonrheumatic aortic (valve) insufficiency: Secondary | ICD-10-CM

## 2020-12-15 ENCOUNTER — Encounter: Payer: Self-pay | Admitting: *Deleted

## 2020-12-15 ENCOUNTER — Encounter: Payer: Self-pay | Admitting: Cardiology

## 2020-12-15 DIAGNOSIS — G4730 Sleep apnea, unspecified: Secondary | ICD-10-CM

## 2020-12-15 DIAGNOSIS — G589 Mononeuropathy, unspecified: Secondary | ICD-10-CM

## 2020-12-15 DIAGNOSIS — M205X9 Other deformities of toe(s) (acquired), unspecified foot: Secondary | ICD-10-CM

## 2020-12-15 DIAGNOSIS — B351 Tinea unguium: Secondary | ICD-10-CM | POA: Insufficient documentation

## 2020-12-15 DIAGNOSIS — N390 Urinary tract infection, site not specified: Secondary | ICD-10-CM

## 2020-12-15 DIAGNOSIS — M6281 Muscle weakness (generalized): Secondary | ICD-10-CM | POA: Insufficient documentation

## 2020-12-15 DIAGNOSIS — N3090 Cystitis, unspecified without hematuria: Secondary | ICD-10-CM

## 2020-12-15 DIAGNOSIS — R262 Difficulty in walking, not elsewhere classified: Secondary | ICD-10-CM | POA: Insufficient documentation

## 2020-12-15 DIAGNOSIS — I82499 Acute embolism and thrombosis of other specified deep vein of unspecified lower extremity: Secondary | ICD-10-CM

## 2020-12-15 DIAGNOSIS — H524 Presbyopia: Secondary | ICD-10-CM

## 2020-12-15 DIAGNOSIS — N400 Enlarged prostate without lower urinary tract symptoms: Secondary | ICD-10-CM | POA: Insufficient documentation

## 2020-12-15 DIAGNOSIS — H2511 Age-related nuclear cataract, right eye: Secondary | ICD-10-CM | POA: Insufficient documentation

## 2020-12-15 DIAGNOSIS — L603 Nail dystrophy: Secondary | ICD-10-CM

## 2020-12-15 DIAGNOSIS — I639 Cerebral infarction, unspecified: Secondary | ICD-10-CM

## 2020-12-15 DIAGNOSIS — I259 Chronic ischemic heart disease, unspecified: Secondary | ICD-10-CM | POA: Insufficient documentation

## 2020-12-15 DIAGNOSIS — E756 Lipid storage disorder, unspecified: Secondary | ICD-10-CM

## 2020-12-15 DIAGNOSIS — H25813 Combined forms of age-related cataract, bilateral: Secondary | ICD-10-CM | POA: Insufficient documentation

## 2020-12-15 DIAGNOSIS — K08123 Complete loss of teeth due to periodontal diseases, class III: Secondary | ICD-10-CM | POA: Insufficient documentation

## 2020-12-15 DIAGNOSIS — M159 Polyosteoarthritis, unspecified: Secondary | ICD-10-CM | POA: Insufficient documentation

## 2020-12-15 DIAGNOSIS — E119 Type 2 diabetes mellitus without complications: Secondary | ICD-10-CM | POA: Insufficient documentation

## 2020-12-15 DIAGNOSIS — R269 Unspecified abnormalities of gait and mobility: Secondary | ICD-10-CM

## 2020-12-15 HISTORY — DX: Unspecified abnormalities of gait and mobility: R26.9

## 2020-12-15 HISTORY — DX: Mononeuropathy, unspecified: G58.9

## 2020-12-15 HISTORY — DX: Difficulty in walking, not elsewhere classified: R26.2

## 2020-12-15 HISTORY — DX: Age-related nuclear cataract, right eye: H25.11

## 2020-12-15 HISTORY — DX: Cystitis, unspecified without hematuria: N30.90

## 2020-12-15 HISTORY — DX: Presbyopia: H52.4

## 2020-12-15 HISTORY — DX: Sleep apnea, unspecified: G47.30

## 2020-12-15 HISTORY — DX: Urinary tract infection, site not specified: N39.0

## 2020-12-15 HISTORY — DX: Chronic ischemic heart disease, unspecified: I25.9

## 2020-12-15 HISTORY — DX: Complete loss of teeth due to periodontal diseases, class III: K08.123

## 2020-12-15 HISTORY — DX: Type 2 diabetes mellitus without complications: E11.9

## 2020-12-15 HISTORY — DX: Other deformities of toe(s) (acquired), unspecified foot: M20.5X9

## 2020-12-15 HISTORY — DX: Lipid storage disorder, unspecified: E75.6

## 2020-12-15 HISTORY — DX: Acute embolism and thrombosis of other specified deep vein of unspecified lower extremity: I82.499

## 2020-12-15 HISTORY — DX: Benign prostatic hyperplasia without lower urinary tract symptoms: N40.0

## 2020-12-15 HISTORY — DX: Nail dystrophy: L60.3

## 2020-12-15 HISTORY — DX: Cerebral infarction, unspecified: I63.9

## 2020-12-15 HISTORY — DX: Muscle weakness (generalized): M62.81

## 2020-12-15 HISTORY — DX: Combined forms of age-related cataract, bilateral: H25.813

## 2020-12-15 HISTORY — DX: Polyosteoarthritis, unspecified: M15.9

## 2020-12-15 HISTORY — DX: Tinea unguium: B35.1

## 2020-12-20 ENCOUNTER — Ambulatory Visit: Payer: Non-veteran care | Admitting: Cardiology

## 2021-02-02 ENCOUNTER — Ambulatory Visit: Payer: Non-veteran care | Admitting: Cardiology

## 2021-06-18 DIAGNOSIS — E1142 Type 2 diabetes mellitus with diabetic polyneuropathy: Secondary | ICD-10-CM | POA: Diagnosis not present

## 2021-06-18 DIAGNOSIS — Z9861 Coronary angioplasty status: Secondary | ICD-10-CM | POA: Diagnosis not present

## 2021-06-18 DIAGNOSIS — I10 Essential (primary) hypertension: Secondary | ICD-10-CM | POA: Diagnosis not present

## 2021-06-18 DIAGNOSIS — E039 Hypothyroidism, unspecified: Secondary | ICD-10-CM | POA: Diagnosis not present

## 2021-06-18 DIAGNOSIS — N1831 Chronic kidney disease, stage 3a: Secondary | ICD-10-CM | POA: Diagnosis not present

## 2021-06-18 DIAGNOSIS — G309 Alzheimer's disease, unspecified: Secondary | ICD-10-CM | POA: Diagnosis not present

## 2021-06-18 DIAGNOSIS — F0280 Dementia in other diseases classified elsewhere without behavioral disturbance: Secondary | ICD-10-CM | POA: Diagnosis not present

## 2021-06-18 DIAGNOSIS — R410 Disorientation, unspecified: Secondary | ICD-10-CM | POA: Diagnosis not present

## 2021-06-18 DIAGNOSIS — I2510 Atherosclerotic heart disease of native coronary artery without angina pectoris: Secondary | ICD-10-CM | POA: Diagnosis not present

## 2021-06-21 DIAGNOSIS — E785 Hyperlipidemia, unspecified: Secondary | ICD-10-CM | POA: Diagnosis not present

## 2021-06-21 DIAGNOSIS — E1142 Type 2 diabetes mellitus with diabetic polyneuropathy: Secondary | ICD-10-CM | POA: Diagnosis not present

## 2021-06-21 DIAGNOSIS — R972 Elevated prostate specific antigen [PSA]: Secondary | ICD-10-CM | POA: Diagnosis not present

## 2021-06-21 DIAGNOSIS — N1831 Chronic kidney disease, stage 3a: Secondary | ICD-10-CM | POA: Diagnosis not present

## 2021-06-21 DIAGNOSIS — E039 Hypothyroidism, unspecified: Secondary | ICD-10-CM | POA: Diagnosis not present

## 2021-06-21 DIAGNOSIS — L820 Inflamed seborrheic keratosis: Secondary | ICD-10-CM | POA: Diagnosis not present

## 2021-09-11 DIAGNOSIS — N390 Urinary tract infection, site not specified: Secondary | ICD-10-CM | POA: Diagnosis not present

## 2022-03-27 DIAGNOSIS — Z9861 Coronary angioplasty status: Secondary | ICD-10-CM | POA: Diagnosis not present

## 2022-03-27 DIAGNOSIS — F0280 Dementia in other diseases classified elsewhere without behavioral disturbance: Secondary | ICD-10-CM | POA: Diagnosis not present

## 2022-03-27 DIAGNOSIS — M5126 Other intervertebral disc displacement, lumbar region: Secondary | ICD-10-CM | POA: Diagnosis not present

## 2022-03-27 DIAGNOSIS — I10 Essential (primary) hypertension: Secondary | ICD-10-CM | POA: Diagnosis not present

## 2022-03-27 DIAGNOSIS — E1142 Type 2 diabetes mellitus with diabetic polyneuropathy: Secondary | ICD-10-CM | POA: Diagnosis not present

## 2022-03-27 DIAGNOSIS — I2510 Atherosclerotic heart disease of native coronary artery without angina pectoris: Secondary | ICD-10-CM | POA: Diagnosis not present

## 2022-03-27 DIAGNOSIS — G309 Alzheimer's disease, unspecified: Secondary | ICD-10-CM | POA: Diagnosis not present

## 2022-03-27 DIAGNOSIS — N1831 Chronic kidney disease, stage 3a: Secondary | ICD-10-CM | POA: Diagnosis not present

## 2022-03-27 DIAGNOSIS — E785 Hyperlipidemia, unspecified: Secondary | ICD-10-CM | POA: Diagnosis not present

## 2022-03-27 DIAGNOSIS — E039 Hypothyroidism, unspecified: Secondary | ICD-10-CM | POA: Diagnosis not present

## 2022-03-27 DIAGNOSIS — R972 Elevated prostate specific antigen [PSA]: Secondary | ICD-10-CM | POA: Diagnosis not present

## 2022-04-02 DIAGNOSIS — R197 Diarrhea, unspecified: Secondary | ICD-10-CM | POA: Diagnosis not present

## 2022-04-02 DIAGNOSIS — E875 Hyperkalemia: Secondary | ICD-10-CM | POA: Diagnosis not present

## 2022-04-02 DIAGNOSIS — R112 Nausea with vomiting, unspecified: Secondary | ICD-10-CM | POA: Diagnosis not present

## 2022-05-29 DIAGNOSIS — I252 Old myocardial infarction: Secondary | ICD-10-CM | POA: Diagnosis not present

## 2022-05-29 DIAGNOSIS — Z8673 Personal history of transient ischemic attack (TIA), and cerebral infarction without residual deficits: Secondary | ICD-10-CM | POA: Diagnosis not present

## 2022-05-29 DIAGNOSIS — N179 Acute kidney failure, unspecified: Secondary | ICD-10-CM | POA: Diagnosis not present

## 2022-05-29 DIAGNOSIS — I10 Essential (primary) hypertension: Secondary | ICD-10-CM | POA: Diagnosis not present

## 2022-05-29 DIAGNOSIS — R739 Hyperglycemia, unspecified: Secondary | ICD-10-CM | POA: Diagnosis not present

## 2022-05-29 DIAGNOSIS — I4510 Unspecified right bundle-branch block: Secondary | ICD-10-CM | POA: Diagnosis not present

## 2022-05-29 DIAGNOSIS — E1165 Type 2 diabetes mellitus with hyperglycemia: Secondary | ICD-10-CM | POA: Diagnosis not present

## 2022-05-29 DIAGNOSIS — E119 Type 2 diabetes mellitus without complications: Secondary | ICD-10-CM | POA: Diagnosis not present

## 2022-05-29 DIAGNOSIS — I440 Atrioventricular block, first degree: Secondary | ICD-10-CM | POA: Diagnosis not present

## 2022-05-29 DIAGNOSIS — F0390 Unspecified dementia without behavioral disturbance: Secondary | ICD-10-CM | POA: Diagnosis not present

## 2022-05-29 DIAGNOSIS — R9431 Abnormal electrocardiogram [ECG] [EKG]: Secondary | ICD-10-CM | POA: Diagnosis not present

## 2022-05-29 DIAGNOSIS — I509 Heart failure, unspecified: Secondary | ICD-10-CM | POA: Diagnosis not present

## 2022-05-29 DIAGNOSIS — I110 Hypertensive heart disease with heart failure: Secondary | ICD-10-CM | POA: Diagnosis not present

## 2022-05-29 DIAGNOSIS — R410 Disorientation, unspecified: Secondary | ICD-10-CM | POA: Diagnosis not present

## 2022-05-29 DIAGNOSIS — I639 Cerebral infarction, unspecified: Secondary | ICD-10-CM | POA: Diagnosis not present

## 2022-05-29 DIAGNOSIS — R400 Somnolence: Secondary | ICD-10-CM | POA: Diagnosis not present

## 2022-05-29 DIAGNOSIS — M179 Osteoarthritis of knee, unspecified: Secondary | ICD-10-CM | POA: Diagnosis not present

## 2022-06-21 DIAGNOSIS — I10 Essential (primary) hypertension: Secondary | ICD-10-CM | POA: Diagnosis not present

## 2022-06-21 DIAGNOSIS — E785 Hyperlipidemia, unspecified: Secondary | ICD-10-CM | POA: Diagnosis not present

## 2022-06-21 DIAGNOSIS — Z139 Encounter for screening, unspecified: Secondary | ICD-10-CM | POA: Diagnosis not present

## 2022-06-21 DIAGNOSIS — E039 Hypothyroidism, unspecified: Secondary | ICD-10-CM | POA: Diagnosis not present

## 2022-06-21 DIAGNOSIS — I2510 Atherosclerotic heart disease of native coronary artery without angina pectoris: Secondary | ICD-10-CM | POA: Diagnosis not present

## 2022-06-21 DIAGNOSIS — N1831 Chronic kidney disease, stage 3a: Secondary | ICD-10-CM | POA: Diagnosis not present

## 2022-06-21 DIAGNOSIS — Z9861 Coronary angioplasty status: Secondary | ICD-10-CM | POA: Diagnosis not present

## 2022-06-21 DIAGNOSIS — F0390 Unspecified dementia without behavioral disturbance: Secondary | ICD-10-CM | POA: Diagnosis not present

## 2022-06-21 DIAGNOSIS — E1142 Type 2 diabetes mellitus with diabetic polyneuropathy: Secondary | ICD-10-CM | POA: Diagnosis not present

## 2022-06-21 DIAGNOSIS — R972 Elevated prostate specific antigen [PSA]: Secondary | ICD-10-CM | POA: Diagnosis not present

## 2022-07-01 DIAGNOSIS — N189 Chronic kidney disease, unspecified: Secondary | ICD-10-CM | POA: Diagnosis not present

## 2022-07-01 DIAGNOSIS — R442 Other hallucinations: Secondary | ICD-10-CM | POA: Diagnosis not present

## 2022-07-01 DIAGNOSIS — R443 Hallucinations, unspecified: Secondary | ICD-10-CM | POA: Diagnosis not present

## 2022-07-01 DIAGNOSIS — F0390 Unspecified dementia without behavioral disturbance: Secondary | ICD-10-CM | POA: Diagnosis not present

## 2022-07-01 DIAGNOSIS — E119 Type 2 diabetes mellitus without complications: Secondary | ICD-10-CM | POA: Diagnosis not present

## 2022-07-03 DIAGNOSIS — G309 Alzheimer's disease, unspecified: Secondary | ICD-10-CM | POA: Diagnosis not present

## 2022-07-03 DIAGNOSIS — R441 Visual hallucinations: Secondary | ICD-10-CM | POA: Diagnosis not present

## 2022-07-03 DIAGNOSIS — F0280 Dementia in other diseases classified elsewhere without behavioral disturbance: Secondary | ICD-10-CM | POA: Diagnosis not present

## 2022-09-23 ENCOUNTER — Telehealth: Payer: Self-pay | Admitting: Cardiology

## 2022-09-23 NOTE — Telephone Encounter (Signed)
Mailed DOT forms to patient. Dr. Dulce Sellar stated PCP needs to complete since patient has not been seen since 2019.

## 2022-10-23 DIAGNOSIS — Z9841 Cataract extraction status, right eye: Secondary | ICD-10-CM | POA: Insufficient documentation

## 2022-10-23 DIAGNOSIS — M545 Low back pain, unspecified: Secondary | ICD-10-CM | POA: Insufficient documentation

## 2022-10-23 DIAGNOSIS — H2523 Age-related cataract, morgagnian type, bilateral: Secondary | ICD-10-CM | POA: Insufficient documentation

## 2022-10-24 ENCOUNTER — Ambulatory Visit: Payer: Non-veteran care | Admitting: Cardiology

## 2022-10-25 ENCOUNTER — Ambulatory Visit: Payer: Non-veteran care | Attending: Cardiology | Admitting: Cardiology

## 2022-12-09 DIAGNOSIS — I959 Hypotension, unspecified: Secondary | ICD-10-CM | POA: Diagnosis not present

## 2022-12-09 DIAGNOSIS — R61 Generalized hyperhidrosis: Secondary | ICD-10-CM | POA: Diagnosis not present

## 2022-12-09 DIAGNOSIS — R55 Syncope and collapse: Secondary | ICD-10-CM | POA: Diagnosis not present

## 2023-02-12 DIAGNOSIS — R55 Syncope and collapse: Secondary | ICD-10-CM | POA: Diagnosis not present

## 2023-02-12 DIAGNOSIS — F0390 Unspecified dementia without behavioral disturbance: Secondary | ICD-10-CM | POA: Diagnosis not present

## 2023-02-12 DIAGNOSIS — I517 Cardiomegaly: Secondary | ICD-10-CM | POA: Diagnosis not present

## 2023-02-12 DIAGNOSIS — I10 Essential (primary) hypertension: Secondary | ICD-10-CM | POA: Diagnosis not present

## 2023-02-12 DIAGNOSIS — I2510 Atherosclerotic heart disease of native coronary artery without angina pectoris: Secondary | ICD-10-CM | POA: Diagnosis not present

## 2023-02-12 DIAGNOSIS — I440 Atrioventricular block, first degree: Secondary | ICD-10-CM | POA: Diagnosis not present

## 2023-02-13 DIAGNOSIS — R55 Syncope and collapse: Secondary | ICD-10-CM | POA: Diagnosis not present

## 2023-02-13 DIAGNOSIS — I10 Essential (primary) hypertension: Secondary | ICD-10-CM | POA: Diagnosis not present

## 2023-02-13 DIAGNOSIS — F0390 Unspecified dementia without behavioral disturbance: Secondary | ICD-10-CM | POA: Diagnosis not present

## 2023-02-13 DIAGNOSIS — I2510 Atherosclerotic heart disease of native coronary artery without angina pectoris: Secondary | ICD-10-CM | POA: Diagnosis not present

## 2023-02-13 DIAGNOSIS — I440 Atrioventricular block, first degree: Secondary | ICD-10-CM | POA: Diagnosis not present

## 2023-02-13 DIAGNOSIS — I517 Cardiomegaly: Secondary | ICD-10-CM | POA: Diagnosis not present

## 2023-02-14 DIAGNOSIS — I2510 Atherosclerotic heart disease of native coronary artery without angina pectoris: Secondary | ICD-10-CM | POA: Diagnosis not present

## 2023-02-14 DIAGNOSIS — I10 Essential (primary) hypertension: Secondary | ICD-10-CM | POA: Diagnosis not present

## 2023-02-14 DIAGNOSIS — F0390 Unspecified dementia without behavioral disturbance: Secondary | ICD-10-CM | POA: Diagnosis not present

## 2023-02-14 DIAGNOSIS — R55 Syncope and collapse: Secondary | ICD-10-CM | POA: Diagnosis not present

## 2023-02-14 DIAGNOSIS — I440 Atrioventricular block, first degree: Secondary | ICD-10-CM | POA: Diagnosis not present

## 2023-02-14 DIAGNOSIS — I517 Cardiomegaly: Secondary | ICD-10-CM | POA: Diagnosis not present

## 2023-02-15 DIAGNOSIS — Z7901 Long term (current) use of anticoagulants: Secondary | ICD-10-CM | POA: Diagnosis not present

## 2023-02-15 DIAGNOSIS — I10 Essential (primary) hypertension: Secondary | ICD-10-CM | POA: Diagnosis not present

## 2023-02-15 DIAGNOSIS — I2510 Atherosclerotic heart disease of native coronary artery without angina pectoris: Secondary | ICD-10-CM | POA: Diagnosis not present

## 2023-02-15 DIAGNOSIS — R001 Bradycardia, unspecified: Secondary | ICD-10-CM | POA: Diagnosis not present

## 2023-06-12 DIAGNOSIS — R300 Dysuria: Secondary | ICD-10-CM | POA: Diagnosis not present

## 2023-06-12 DIAGNOSIS — N3001 Acute cystitis with hematuria: Secondary | ICD-10-CM | POA: Diagnosis not present

## 2023-08-12 DIAGNOSIS — R269 Unspecified abnormalities of gait and mobility: Secondary | ICD-10-CM | POA: Diagnosis not present

## 2023-08-12 DIAGNOSIS — E119 Type 2 diabetes mellitus without complications: Secondary | ICD-10-CM | POA: Diagnosis not present

## 2023-08-12 DIAGNOSIS — R413 Other amnesia: Secondary | ICD-10-CM | POA: Diagnosis not present

## 2023-08-12 DIAGNOSIS — H9193 Unspecified hearing loss, bilateral: Secondary | ICD-10-CM | POA: Diagnosis not present

## 2023-08-12 DIAGNOSIS — E039 Hypothyroidism, unspecified: Secondary | ICD-10-CM | POA: Diagnosis not present

## 2023-08-12 DIAGNOSIS — Z125 Encounter for screening for malignant neoplasm of prostate: Secondary | ICD-10-CM | POA: Diagnosis not present

## 2023-08-12 DIAGNOSIS — I519 Heart disease, unspecified: Secondary | ICD-10-CM | POA: Diagnosis not present

## 2023-08-28 DIAGNOSIS — Z79899 Other long term (current) drug therapy: Secondary | ICD-10-CM | POA: Diagnosis not present

## 2023-08-28 DIAGNOSIS — R972 Elevated prostate specific antigen [PSA]: Secondary | ICD-10-CM | POA: Diagnosis not present

## 2023-08-28 DIAGNOSIS — N401 Enlarged prostate with lower urinary tract symptoms: Secondary | ICD-10-CM | POA: Diagnosis not present
